# Patient Record
Sex: Female | Born: 1985 | Race: Black or African American | Hispanic: No | Marital: Single | Smoking: Never smoker
Health system: Southern US, Community
[De-identification: ages and names within clinical notes are randomized; demographics above are authoritative.]

## PROBLEM LIST (undated history)

## (undated) ENCOUNTER — Inpatient Hospital Stay (HOSPITAL_COMMUNITY): Payer: Self-pay

## (undated) DIAGNOSIS — Z789 Other specified health status: Secondary | ICD-10-CM

---
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
## 2005-05-17 ENCOUNTER — Inpatient Hospital Stay (HOSPITAL_COMMUNITY): Admission: AD | Admit: 2005-05-17 | Discharge: 2005-05-17 | Payer: Self-pay | Admitting: Obstetrics and Gynecology

## 2005-08-12 ENCOUNTER — Emergency Department (HOSPITAL_COMMUNITY): Admission: EM | Admit: 2005-08-12 | Discharge: 2005-08-12 | Payer: Self-pay | Admitting: Family Medicine

## 2005-11-07 ENCOUNTER — Ambulatory Visit (HOSPITAL_COMMUNITY): Admission: RE | Admit: 2005-11-07 | Discharge: 2005-11-07 | Payer: Self-pay | Admitting: Obstetrics and Gynecology

## 2005-11-21 ENCOUNTER — Ambulatory Visit: Payer: Self-pay | Admitting: *Deleted

## 2005-11-21 ENCOUNTER — Inpatient Hospital Stay (HOSPITAL_COMMUNITY): Admission: AD | Admit: 2005-11-21 | Discharge: 2005-11-21 | Payer: Self-pay | Admitting: *Deleted

## 2006-03-14 ENCOUNTER — Emergency Department (HOSPITAL_COMMUNITY): Admission: EM | Admit: 2006-03-14 | Discharge: 2006-03-14 | Payer: Self-pay | Admitting: Emergency Medicine

## 2006-03-25 ENCOUNTER — Inpatient Hospital Stay (HOSPITAL_COMMUNITY): Admission: AD | Admit: 2006-03-25 | Discharge: 2006-03-25 | Payer: Self-pay | Admitting: Obstetrics and Gynecology

## 2006-03-26 ENCOUNTER — Inpatient Hospital Stay (HOSPITAL_COMMUNITY): Admission: AD | Admit: 2006-03-26 | Discharge: 2006-03-26 | Payer: Self-pay | Admitting: Obstetrics and Gynecology

## 2006-04-06 ENCOUNTER — Inpatient Hospital Stay (HOSPITAL_COMMUNITY): Admission: AD | Admit: 2006-04-06 | Discharge: 2006-04-14 | Payer: Self-pay | Admitting: Obstetrics and Gynecology

## 2006-04-06 ENCOUNTER — Encounter (INDEPENDENT_AMBULATORY_CARE_PROVIDER_SITE_OTHER): Payer: Self-pay | Admitting: Specialist

## 2006-10-25 ENCOUNTER — Emergency Department (HOSPITAL_COMMUNITY): Admission: EM | Admit: 2006-10-25 | Discharge: 2006-10-25 | Payer: Self-pay | Admitting: Emergency Medicine

## 2006-12-22 ENCOUNTER — Emergency Department (HOSPITAL_COMMUNITY): Admission: EM | Admit: 2006-12-22 | Discharge: 2006-12-22 | Payer: Self-pay | Admitting: Emergency Medicine

## 2007-01-03 ENCOUNTER — Emergency Department (HOSPITAL_COMMUNITY): Admission: EM | Admit: 2007-01-03 | Discharge: 2007-01-03 | Payer: Self-pay | Admitting: Family Medicine

## 2007-10-03 ENCOUNTER — Inpatient Hospital Stay (HOSPITAL_COMMUNITY): Admission: AD | Admit: 2007-10-03 | Discharge: 2007-10-03 | Payer: Self-pay | Admitting: Obstetrics and Gynecology

## 2008-01-22 ENCOUNTER — Emergency Department (HOSPITAL_COMMUNITY): Admission: EM | Admit: 2008-01-22 | Discharge: 2008-01-22 | Payer: Self-pay | Admitting: Family Medicine

## 2008-04-17 ENCOUNTER — Inpatient Hospital Stay (HOSPITAL_COMMUNITY): Admission: RE | Admit: 2008-04-17 | Discharge: 2008-04-20 | Payer: Self-pay | Admitting: Obstetrics and Gynecology

## 2008-09-28 ENCOUNTER — Emergency Department (HOSPITAL_COMMUNITY): Admission: EM | Admit: 2008-09-28 | Discharge: 2008-09-28 | Payer: Self-pay | Admitting: Emergency Medicine

## 2009-05-30 ENCOUNTER — Emergency Department (HOSPITAL_COMMUNITY): Admission: EM | Admit: 2009-05-30 | Discharge: 2009-05-30 | Payer: Self-pay | Admitting: Family Medicine

## 2009-06-16 ENCOUNTER — Emergency Department (HOSPITAL_COMMUNITY): Admission: EM | Admit: 2009-06-16 | Discharge: 2009-06-16 | Payer: Self-pay | Admitting: Family Medicine

## 2010-08-31 ENCOUNTER — Emergency Department (HOSPITAL_COMMUNITY): Admission: EM | Admit: 2010-08-31 | Discharge: 2010-08-31 | Payer: Self-pay | Admitting: Family Medicine

## 2011-02-04 LAB — POCT URINALYSIS DIP (DEVICE)
Hgb urine dipstick: NEGATIVE
Nitrite: NEGATIVE
Protein, ur: NEGATIVE mg/dL
Specific Gravity, Urine: >=1.03 (ref 1.005–1.030)
Urobilinogen, UA: 0.2 mg/dL (ref 0.0–1.0)
pH: 5 (ref 5.0–8.0)

## 2011-02-04 LAB — URINE CULTURE: Colony Count: 15000

## 2011-02-04 LAB — WET PREP, GENITAL
Trich, Wet Prep: NONE SEEN
Yeast Wet Prep HPF POC: NONE SEEN

## 2011-02-04 LAB — POCT PREGNANCY, URINE: Preg Test, Ur: NEGATIVE

## 2011-02-04 LAB — GC/CHLAMYDIA PROBE AMP, GENITAL: Chlamydia, DNA Probe: NEGATIVE

## 2011-03-14 NOTE — Op Note (Signed)
Kiara Bates, Kiara Bates                ACCOUNT NO.:  [id number]   MEDICAL RECORD NO.:  [id number]          PATIENT TYPE:  INP   LOCATION:  9145                          FACILITY:  [facility]   PHYSICIAN:  Huel Cote, M.D. DATE OF BIRTH:  Aug 20, 1986   DATE OF PROCEDURE:  04/17/2008  DATE OF DISCHARGE:                               OPERATIVE REPORT   PREOPERATIVE DIAGNOSIS:  1. Previous cesarean section declines vaginal birth after cesarean      section.  2. 39+ weeks' gestation.   POSTOPERATIVE DIAGNOSIS:  1. Previous cesarean section declines vaginal birth after cesarean      section.  2. 39+ weeks' gestation.   PROCEDURE:  Repeat low transverse cesarean section with two-layer  closure of uterus.   SURGEON:  Huel Cote, MD   ASSISTANT:  Sherron Monday, MD   ANESTHESIA:  Spinal.   FINDINGS:  A viable female infant, vertex, Apgars of 9 and 9, weight of 7  pounds 9 ounces, uterus, ovaries and tubes are normal.   SPECIMEN:  Placenta was sent to labor and delivery.   ESTIMATED BLOOD LOSS:  600 mL.   URINE OUTPUT:  200 mL clear urine.   IV FLUIDS:  2300 mL LR.   PROCEDURE:  The patient was taken to the operating room where spinal  anesthesia was obtained without difficulty.  She was then prepped and  draped in normal sterile fashion in dorsal supine position with a  leftward tilt.  A Pfannenstiel's skin incision was then made through a  preexisting scar and carried through to the underlying layer of fascia  by sharp dissection and Bovie cautery.  Fascia was then nicked in the  midline and the incision was extended laterally with Mayo scissors.  The  inferior aspect of the fascia was then grasped with Kocher clamps,  elevated and dissected off the underlying rectus muscles.  In a similar  fashion, the superior aspect was dissected off the rectus muscles.  The  peritoneal cavity was entered on dissecting the fascia and this was  extended both superiorly and inferiorly with  careful attention to avoid  both bowel and bladder.  The Alexis self-retaining wound retractor was  then placed within the incision and lower uterine segment was exposed  nicely.  This was then incised in a transverse fashion and the cavity  itself entered bluntly.  This was then extended bluntly and the  membranes ruptured with clear fluid noted.  The infant's head was then  delivered atraumatically, bulb suctioned and the remainder of the body  delivered without difficulty.  The cord was clamped and cut and the  infant handed to the waiting pediatricians.  The uterus was then  examined and no significant active bleeding noted except for along the  incision line and had nice tone.  The placenta was expressed manually  and handed off to pathology.  The uterine cavity was then cleared of all  clots and debris with moist lap sponge.  The uterine incision was then  closed in two layers, the first in a running locked layer of 0 chromic,  the  second in imbricating layer of the same.  There was some bleeding of  the lower uterine segment which seemed to subside after closure of the  incision.  The uterine incision at this point appeared completely  hemostatic.  The ovaries and tubes were inspected and found to be  normal.  The instruments and sponges were removed from the abdomen.  The  uterine incision once again examined and found to be hemostatic.  Therefore, the rectus muscles were re-approximated with several  interrupted mattress sutures of 0 Vicryl.  The fascia was closed with 0  Vicryl in a running fashion, and the skin was closed with staples.  Sponge, lap and needle counts were correct x2 and the patient was taken  to the recovery room in stable condition.      Huel Cote, M.D.  Electronically Signed     KR/MEDQ  D:  04/17/2008  T:  04/17/2008  Job:  [id number]

## 2011-03-14 NOTE — H&P (Signed)
Kiara, Bates                ACCOUNT NO.:  [id number]   MEDICAL RECORD NO.:  [id number]          PATIENT TYPE:  INP   LOCATION:  NA                            [facility]  [facility]   PHYSICIAN:  Huel Cote, M.D. DATE OF BIRTH:  1986/05/29   DATE OF ADMISSION:  DATE OF DISCHARGE:                              HISTORY & PHYSICAL   The patient is a 25 year old, G2, P1-0-0-1 who is scheduled for a repeat  C-section at 36 plus weeks' gestation.  Her estimated due date is April 22, 2008 by an ultrasound preformed in the first trimester at 24 weeks'  gestation.  She presented for prenatal care at approximately 97 weeks'  gestation and had a quad screen which was normal.  Her prenatal care has  been relatively uneventful except for a history of her prior C-section  and a desire for a repeat C-section and declining trial of labor.  She  had a positive group B strep with her last pregnancy, and the first baby  actually had a group B strep infection and had to go to the NICU.   Prenatal labs are as follows:  B+, antibody negative, RPR nonreactive,  rubella immune, hepatitis B surface antigen negative, HIV negative, GC  negative, chlamydia negative, 1-hour Glucola normal at 119.   Her past medical history is insignificant.   PAST SURGICAL HISTORY:  In 2007, she had a C-section performed for fetal  tachycardia, nonreassuring fetal tracing and was delivered of a 7-pound  9-ounce infant that was in June 2007.  She has had no other pregnancies.   PAST GYNECOLOGIC HISTORY:  None.   ALLERGIES:  Include latex.   MEDICATIONS:  None.   The patient's last documented appointment in the office was June 2.  At  that time, her blood pressure was 120/80, and her weight was 188 pounds.  The fundal height was appropriate for growth measuring 37 for 36 weeks,  and she had a negative urine with no protein or other problems.  The  patient then failed to keep her next appointment, and on the day prior  to surgery was contacted expressing a desire to proceed with the C-  section.  A careful preoperative counseling was done by phone in which  the patient was described the procedure in detail.  We also discussed  the risks and benefits of surgery including bleeding, infection and  possible damage to bowel and bladder.  She was given the opportunity to  reschedule the surgery and have an appointment in person.  However,  strongly insisted that she proceed with her C-section, and assuming on  presentation to the hospital a more current blood pressure and other  parameters are normal, I felt this was reasonable.  Again, she had been  counseled carefully by phone, and I  will see the patient early in the morning 1 hour prior her C-section to  assess the current blood pressure or fetal heart rate and other  parameters to assure she is stable to proceed with surgery.   I will record a physical exam from tomorrow morning after  seeing the  patient.      Huel Cote, M.D.  Electronically Signed     KR/MEDQ  D:  04/16/2008  T:  04/16/2008  Job:  [id number]

## 2011-03-17 NOTE — Discharge Summary (Signed)
NAMEAYSLIN, KUNDERT                ACCOUNT NO.:  [id number]   MEDICAL RECORD NO.:  [id number]          PATIENT TYPE:  INP   LOCATION:  9316                          [facility]ACILITY:  [facility]   PHYSICIAN:  Malachi Pro. Ambrose Mantle, M.D. DATE OF BIRTH:  1986/07/01   DATE OF ADMISSION:  04/06/2006  DATE OF DISCHARGE:  04/14/2006                                 DISCHARGE SUMMARY   HISTORY OF PRESENT ILLNESS:  This 25 year old black female, para 0, gravida  1 at [redacted] weeks gestation presented to the office for nonstress test at 40  weeks with nonreactive strip noted, no decelerations.  The patient  complained of contractions every 3-4 minutes but cervix was minimally  changed.  The patient was sent for delivery given the nonreassuring tracing.  Prenatal care was uncomplicated except for positive group B strep in the  urine.   PRENATAL LABS:  B+ with a negative antibody, sickle cell negative, RPR  nonreactive, rubella immune, hepatitis B surface antigen negative, HIV  declined.  GC and chlamydia negative.  Group B strep positive.  Triple  screen normal was 100.  A 1-hour Glucola was 87.   PAST OBSTETRICAL HISTORY:  Negative.   GYNECOLOGIC HISTORY:  Negative.   SURGICAL HISTORY:  Negative.   MEDICAL HISTORY:  Negative.   ALLERGIES:  The patient was allergic to latex.   PHYSICAL EXAMINATION:  VITAL SIGNS:  On admission, her temperature was noted  to be 10.  Otherwise her vital signs were normal.  Fetal heart rate did show  improved variability after a bolus of fluid.  HEART/LUNGS:  Normal.  ABDOMEN:  The abdomen was soft and nontender.  PELVIC:  Cervix was 2 cm, 70% vertex and -2.  The patient had spontaneous  rupture of membranes with clear fluid.   CHIEF COMPLAINT:  Dr. Senaida Ores thought the temperature elevation was  probably chorioamnionitis.  She was placed on penicillin for positive group  B strep initially but then with the fever was change to IV Unisom.  Fetal  heart rate was acceptable with  good variability, no significant  decelerations.  Intrauterine pressure catheter was placed and fetal scalp  electrode was placed.  By 8:30 p.m., the patient's temperature had spiked to  103.8 degrees Celsius.  Urinalysis was negative.  Cervix was 4 cm.  By 9:40  p.m. the patient was comfortable.  The fetal heart rate increased into 180  days with increased variable decelerations.  The cervix was 5 cm.  Dr.  Senaida Ores felt she should proceed to C-section.  The patient underwent a  low transverse cervical C-section with delivery of a living female infant 7  pounds 9 ounces, Apgars of 1 at one minute, 9 at five minutes.  Uterus,  tubes and ovaries were normal.  The cord pH was 7.24.  A culture of the  placenta was done.  Postoperatively, the patient did acceptably well.  The  IV unison was continued.  By the third postoperative day she had been  afebrile.  She did plan to stay until the fourth postoperative day because  the baby was in the  NICU but on the evening of the third postoperative day,  she did have chills.  Temperature went to 100.5.  The IV antibiotics had  been discontinued, and the Indocin had been begun.  On the fifth  postoperative day, the patient felt cold symptoms, congestion and mild cough  otherwise was asymptomatic.  Temperature had been 101 degrees at 6:00 a.m.  The patient was placed on IV Unisom and gentamicin was begun on April 12, 2006.  The patient's temperature maximum had been 102.8 degrees at 6:00 p.m.  the night before.  She was feeling better and thereafter remained afebrile  for the last 60 hours.  She has had no more chills.  Her temperature maximum  in the last 60 hours has been 99.3 degrees.  She is ready for discharge.  Laboratory data showed no growth in the blood cultures to date.  Initial  hemoglobin 11.8, hematocrit 34.7.  White count 12,100, platelet count  267,000.  Hemoglobin dropped to 8.0, normalized at 8.4, white counts follow-  up were 19.2,  13.4, 14.9 and 15.3.  There were greater than 20% bands on the  initial white count postpartum.  Serum creatinine was 0.7.  Urinalysis was  negative except for 11-20 red cells.  Catheterization urine showed no  growth.  The culture from the placenta showed a rare group B strep.  RPR was  nonreactive.  The patient's staples have been removed.  Strips were applied.  The patient is ready for discharge.   FINAL DIAGNOSES:  Intrauterine pregnancy at 40 weeks with febrile morbidity  abor, presumed secondary to chorioamnionitis.   OPERATION:  Low transverse cervical cesarean section.   FINAL CONDITION:  Improved.   DISCHARGE INSTRUCTIONS:  Instructions include regular diet, no vaginal  entrance, no heavy lifting or strenuous activity.  Call with any temperature  elevation greater than 100 degrees.  Call with any unusual problems.  Follow  our discharge instruction booklet.   DISCHARGE MEDICATIONS:  1.  Augmentin 875 mg one twice a day for seven days.  2.  Percocet 5/325 as needed for pain.  3.  Motrin 600 mg to take as needed for pain.   FOLLOW P:  The patient is to return in 10 days for followup examination.      Malachi Pro. Ambrose Mantle, M.D.     TFH/MEDQ  D:  04/14/2006  T:  04/14/2006  Job:  [id number]

## 2011-03-17 NOTE — Discharge Summary (Signed)
Kiara Bates, Kiara Bates                ACCOUNT NO.:  [id number]   MEDICAL RECORD NO.:  [id number]          PATIENT TYPE:  INP   LOCATION:  9145                          [facility]ACILITY:  [facility]   PHYSICIAN:  Huel Cote, M.D. DATE OF BIRTH:  09/05/1986   DATE OF ADMISSION:  04/17/2008  DATE OF DISCHARGE:  04/20/2008                               DISCHARGE SUMMARY   DISCHARGE DIAGNOSES:  1. Term pregnancy at 56 weeks' gestation.  2. Status post repeat scheduled cesarean section, low-transverse.   DISCHARGE MEDICATIONS:  1. Motrin 600 mg p.o. every 6 hours.  2. Darvocet 1-2 tablets p.o. every 4 hours p.r.n.   DISCHARGE FOLLOWUP:  The patient is to follow up in the office in 2  weeks for an incision check.   HOSPITAL COURSE:  The patient is a 25 year old G2, P1-0-0-1 who was  scheduled for repeat C-section at 39 weeks' gestation.  She underwent  this procedure without difficulty and was delivered of a viable female  infant, vertex presentation.  Apgars were 9 and 9.  Weight was 7 pounds  9 ounces.  She was then admitted for routine postoperative care.  By  postop day #3, she was doing quite well, having no complaints, and  voiding without difficulty.  She was afebrile with stable vital signs.  Fundus was firm and incision was clear.  She was felt stable for  discharge home and her discharge hemoglobin was 9.6.  She is to follow  up in the office as stated in 2 weeks.      Huel Cote, M.D.  Electronically Signed     KR/MEDQ  D:  05/11/2008  T:  05/12/2008  Job:  [id number]

## 2011-03-17 NOTE — Op Note (Signed)
Kiara Bates, ALKINS                ACCOUNT NO.:  [id number]   MEDICAL RECORD NO.:  [id number]          PATIENT TYPE:  INP   LOCATION:  9316                          [facility]   PHYSICIAN:  Huel Cote, M.D. DATE OF BIRTH:  1986/07/29   DATE OF PROCEDURE:  04/06/2006  DATE OF DISCHARGE:                                 OPERATIVE REPORT   PREOPERATIVE DIAGNOSES:  1.  Term pregnancy at 40 weeks, delivered.  2.  Chorioamnionitis.  3.  Persistent fetal tachycardia with variable decelerations remote from      delivery.   POSTOPERATIVE DIAGNOSES:  1.  Term pregnancy at 40 weeks, delivered.  2.  Chorioamnionitis.  3.  Persistent fetal tachycardia with variable decelerations remote from      delivery.  4.  Meconium-stained fluid.   PROCEDURE:  Primary low transverse C-section with double layer closure of  the uterus.   SURGEON:  Dr. Huel Cote   ANESTHESIA:  Epidural.   PLACENTA:  Sent to pathology and placenta cultures were done.   ESTIMATED BLOOD LOSS:  600 mL.   URINE OUTPUT:  100 mL slightly blood-tinged urine upon arrival in the OR  which cleared after delivery.   IV FLUIDS:  1500 mL LR.   FINDINGS:  There is a viable female infant in the vertex presentation.  Apgars  were 1 and 9.  Weight was 7 pounds 9 ounces; normal ovaries and tubes were  noted bilaterally; placenta culture was done   PROCEDURE:  The patient was taken to the operating room where epidural  anesthesia was found be adequate by Allis clamp test.  She was then prepped  and draped in normal sterile fashion in the dorsal supine position with a  leftward tilt.  A Pfannenstiel skin incision was then made with the scalpel  and carried through to underlying layer of fascia by sharp dissection and  Bovie cautery.  Fascia was then opened in the midline and the incision  extended laterally with Mayo scissors.  Inferior aspect was then grasped  with Kocher clamps, elevated, and dissected off the  rectus muscles.  In a  similar fashion, superior aspect was dissected off the rectus muscles.  The  rectus muscles were separated in the midline and the peritoneum entered  bluntly.  Peritoneal incision was then extended both superiorly and  inferiorly with careful attention to avoid both bowel and bladder.  The  Alexis large self-retaining wound retractor was then placed within the  incision and lower uterine segment exposed nicely.  This was then incised in  a transverse fashion and the cavity itself entered bluntly.  At this point,  meconium-stained fluid was noted, although this had not been appreciated  prior to C-section.  The DeLee was obtained.  The infant's head was elevated  to the incision, however, could not quite be delivered digitally.  Therefore  the Kiwi vacuum was placed with ohm, and the infant's head was then  delivered atraumatically.  The mouth and nose were suctioned with DeLee  suction and the remainder of the infant delivered and somewhat quite floppy.  Cord was clamped and cut, and the infant was immediately handed off to the  pediatricians.  Although the infant was floppy at 1 minute, it quickly  regained tone and responded to stimulation and blow-by with a five minute  Apgar of 9.  The cord pH was obtained and had to be venous as the arterial  was inadequate to get a specimen, and the cord pH venous was 7.24.  At this  point, the placenta was delivered manually, and a placenta culture was  taken.  The uterus was cleared of all clots and debris with a moist lap  sponge, and the uterine incision itself was then closed in 2 layers, the  first a running locked layer of 0 chromic.  The second an imbricating layer  of the same.  At this point, excellent hemostasis was noted.  The uterus and  tubes and ovaries were inspected and found to be hemostatic.  There is no  active bleeding noted.  The abdomen and pelvis were copiously irrigated x2,  and the fascia was then  closed with 0 Vicryl in a running fashion.  The skin  was closed with staples.  Sponge, lap, needle counts were correct x2, and  the patient was taken to the recovery room in stable condition.      Huel Cote, M.D.  Electronically Signed     KR/MEDQ  D:  04/06/2006  T:  04/07/2006  Job:  [id number]

## 2011-05-22 ENCOUNTER — Emergency Department (HOSPITAL_COMMUNITY)
Admission: EM | Admit: 2011-05-22 | Discharge: 2011-05-22 | Disposition: A | Discharge status: Home / Self Care | Payer: 59 | Attending: Emergency Medicine | Admitting: Emergency Medicine

## 2011-05-22 DIAGNOSIS — K089 Disorder of teeth and supporting structures, unspecified: Secondary | ICD-10-CM | POA: Insufficient documentation

## 2011-05-22 DIAGNOSIS — K047 Periapical abscess without sinus: Secondary | ICD-10-CM | POA: Insufficient documentation

## 2011-06-07 ENCOUNTER — Inpatient Hospital Stay (HOSPITAL_COMMUNITY): Payer: 59

## 2011-06-07 ENCOUNTER — Inpatient Hospital Stay (HOSPITAL_COMMUNITY)
Admission: AD | Admit: 2011-06-07 | Discharge: 2011-06-07 | Disposition: A | Discharge status: Home / Self Care | Payer: 59 | Source: Ambulatory Visit | Attending: Obstetrics and Gynecology | Admitting: Obstetrics and Gynecology

## 2011-06-07 ENCOUNTER — Encounter (HOSPITAL_COMMUNITY): Payer: Self-pay | Admitting: *Deleted

## 2011-06-07 DIAGNOSIS — O039 Complete or unspecified spontaneous abortion without complication: Secondary | ICD-10-CM

## 2011-06-07 HISTORY — DX: Other specified health status: Z78.9

## 2011-06-07 LAB — HCG, QUANTITATIVE, PREGNANCY: hCG, Beta Chain, Quant, S: 7964 m[IU]/mL — ABNORMAL HIGH (ref <5)

## 2011-06-07 LAB — CBC
HCT: 33.9 % — ABNORMAL LOW (ref 36.0–46.0)
Hemoglobin: 11.7 g/dL — ABNORMAL LOW (ref 12.0–15.0)
MCH: 32.6 pg (ref 26.0–34.0)
MCHC: 34.5 g/dL (ref 30.0–36.0)
Platelets: 270 10*3/uL (ref 150–400)

## 2011-06-07 LAB — ABO/RH: ABO/RH(D): B POS

## 2011-06-07 LAB — POCT PREGNANCY, URINE: Preg Test, Ur: POSITIVE

## 2011-06-07 NOTE — Progress Notes (Signed)
E. RICE, PA  AT BEDSIDE

## 2011-06-07 NOTE — Progress Notes (Signed)
BACK  FROM [facility] .  PT SAYS LESS BLEEDING NOW.  SAYS 10 MIN AGO WHEN GOING TO B-ROOM- PASSED FIST SIZE CLOT.Marland Kitchen  CRAMPING  STILL HURTS BUT LESS OFTEN

## 2011-06-07 NOTE — Progress Notes (Signed)
Was spotting yesterday and soaked a pad this morning since 5pm.  10 wks preg.  HPT+, G3P2

## 2011-06-07 NOTE — ED Provider Notes (Addendum)
History   Pt presents today c/o vag bleeding in preg. She states she began having some mild spotting yesterday and began passing large clots this morning about 5am. She states the bleeding has slowed since coming to the [facility]U. She denies recent intercourse.  Chief Complaint  Patient presents with  . Vaginal Bleeding   HPI  OB History    Grav Para Term Preterm Abortions TAB SAB Ect Mult Living   3 2        2       Past Medical History  Diagnosis Date  . No pertinent past medical history     Past Surgical History  Procedure Date  . Cesarean section     No family history on file.  History  Substance Use Topics  . Smoking status: Never Smoker   . Smokeless tobacco: Not on file  . Alcohol Use: No    Allergies: No Known Allergies  Prescriptions prior to admission  Medication Sig Dispense Refill  . ibuprofen (ADVIL,MOTRIN) 800 MG tablet Take 800 mg by mouth every 8 (eight) hours as needed. For tooth abcess       . penicillin v potassium (VEETID) 500 MG tablet Take 500 mg by mouth 4 (four) times daily.        . traMADol (ULTRAM) 50 MG tablet Take 50 mg by mouth every 6 (six) hours as needed. For pain         Review of Systems  Constitutional: Negative for fever and chills.  Cardiovascular: Negative for chest pain.  Gastrointestinal: Positive for abdominal pain. Negative for nausea, vomiting, diarrhea and constipation.  Genitourinary: Negative for dysuria, urgency, frequency and hematuria.  Neurological: Negative for dizziness and headaches.  Psychiatric/Behavioral: Negative for depression and suicidal ideas.   Physical Exam   Blood pressure 128/81, pulse 85, temperature 98.5 F (36.9 C), temperature source Oral, resp. rate 20, height 5\' 5"  (1.651 m), weight 170 lb (77.111 kg), last menstrual period 03/27/2011.  Physical Exam  Constitutional: She is oriented to person, place, and time. She appears well-developed and well-nourished. No distress.  HENT:  Head:  Normocephalic and atraumatic.  Eyes: EOM are normal. Pupils are equal, round, and reactive to light.  GI: Soft. She exhibits no distension. There is no tenderness. There is no rebound and no guarding.  Genitourinary: There is bleeding around the vagina. No tenderness around the vagina. No foreign body around the vagina. No vaginal discharge found.       Minimal amount of vag bleeding noted on exam. Cervix is slightly dilated.  Neurological: She is alert and oriented to person, place, and time.  Skin: Skin is warm and dry. She is not diaphoretic.  Psychiatric: She has a normal mood and affect. Her behavior is normal. Judgment and thought content normal.    MAU Course  Procedures  Results for orders placed during the hospital encounter of 06/07/11 (from the past 24 hour(s))  POCT PREGNANCY, URINE     Status: Normal   Collection Time   06/07/11  6:13 AM      Component Value Range   Preg Test, Ur POSITIVE    CBC     Status: Abnormal   Collection Time   06/07/11  6:48 AM      Component Value Range   WBC 8.4  4.0 - 10.5 (K/uL)   RBC 3.59 (*) 3.87 - 5.11 (MIL/uL)   Hemoglobin 11.7 (*) 12.0 - 15.0 (g/dL)   HCT 16.1 (*) 09.6 - 46.0 (%)  MCV 94.4  78.0 - 100.0 (fL)   MCH 32.6  26.0 - 34.0 (pg)   MCHC 34.5  30.0 - 36.0 (g/dL)   RDW 78.2  95.6 - 21.3 (%)   Platelets 270  150 - 400 (K/uL)  ABO/RH     Status: Normal   Collection Time   06/07/11  6:48 AM      Component Value Range   ABO/RH(D) B POS     B-quant pending.  [place] report: Findings consistent with a spontaneous abortion in progress in this patient with vaginal bleeding. The endometrium is thickened and heterogeneous, suggesting blood products/products of conception, and there is a cystic area in the endocervical canal that may be the gestational sac.  Assessment and Plan  SAB in progress: discussed with pt at length. B-quant pending. Will have pt return in 2 days for repeat B-quant and reevaluation. Discussed diet, activity, risks,  and precautions. Pt will return immediately if she begins to have worsening sx including heavy vag bleeding. She understood and agreed.  Clinton Gallant. Risa Auman III, DrHSc, MPAS, PA-C  06/07/2011, 8:54 AM   Henrietta Hoover, PA 06/07/11 [phone]

## 2011-06-07 NOTE — Progress Notes (Signed)
PT IN [facility]

## 2011-07-27 LAB — CBC
HCT: 28 — ABNORMAL LOW
HCT: 35.2 — ABNORMAL LOW
Hemoglobin: 11.8 — ABNORMAL LOW
Hemoglobin: 9.6 — ABNORMAL LOW
MCV: 98.5
MCV: 99.1
Platelets: 142 — ABNORMAL LOW
RBC: 3.57 — ABNORMAL LOW

## 2011-07-27 LAB — TYPE AND SCREEN
ABO/RH(D): B POS
Antibody Screen: NEGATIVE

## 2011-08-07 LAB — URINALYSIS, ROUTINE W REFLEX MICROSCOPIC
Bilirubin Urine: NEGATIVE
Hgb urine dipstick: NEGATIVE
pH: 7

## 2011-08-07 LAB — URINE CULTURE

## 2011-08-07 LAB — WET PREP, GENITAL
Trich, Wet Prep: NONE SEEN
Yeast Wet Prep HPF POC: NONE SEEN

## 2011-08-07 LAB — CBC
HCT: 36.1
MCV: 94.4
Platelets: 346
RBC: 3.82 — ABNORMAL LOW
WBC: 10.3

## 2011-08-07 LAB — URINE MICROSCOPIC-ADD ON

## 2012-01-27 ENCOUNTER — Encounter (HOSPITAL_COMMUNITY): Payer: Self-pay | Admitting: *Deleted

## 2012-01-27 ENCOUNTER — Emergency Department (INDEPENDENT_AMBULATORY_CARE_PROVIDER_SITE_OTHER)
Admission: EM | Admit: 2012-01-27 | Discharge: 2012-01-27 | Disposition: A | Discharge status: Home / Self Care | Payer: Medicaid Other | Source: Home / Self Care

## 2012-01-27 DIAGNOSIS — K5289 Other specified noninfective gastroenteritis and colitis: Secondary | ICD-10-CM

## 2012-01-27 DIAGNOSIS — K529 Noninfective gastroenteritis and colitis, unspecified: Secondary | ICD-10-CM

## 2012-01-27 LAB — POCT URINALYSIS DIP (DEVICE)
Bilirubin Urine: NEGATIVE
Glucose, UA: NEGATIVE mg/dL
Nitrite: NEGATIVE
Specific Gravity, Urine: 1.02 (ref 1.005–1.030)
Urobilinogen, UA: 0.2 mg/dL (ref 0.0–1.0)

## 2012-01-27 LAB — POCT PREGNANCY, URINE: Preg Test, Ur: NEGATIVE

## 2012-01-27 MED ORDER — ONDANSETRON HCL 4 MG PO TABS: 4.0000 mg | ORAL_TABLET | Freq: Four times a day (QID) | ORAL | Status: AC

## 2012-01-27 MED ORDER — ONDANSETRON 4 MG PO TBDP
4.0000 mg | ORAL_TABLET | Freq: Once | ORAL | Status: AC
  Administered 2012-01-27: 4 mg via ORAL

## 2012-01-27 MED ORDER — ONDANSETRON 4 MG PO TBDP
ORAL_TABLET | ORAL | Status: AC
  Filled 2012-01-27: qty 1

## 2012-01-27 NOTE — ED Notes (Signed)
Drinking gingerale no further vomiting

## 2012-01-27 NOTE — ED Provider Notes (Signed)
History     CSN: [id number]  Arrival date & time 01/27/12  1723   None     Chief Complaint  Patient presents with  . Nausea  . Emesis  . Diarrhea  . Generalized Body Aches    (Consider location/radiation/quality/duration/timing/severity/associated sxs/prior treatment) HPI Comments: Vomiting and diarrhea since yesterday. Unable to keep liquids down. Aches all over. No fever.   Patient is a 26 y.o. female presenting with vomiting and diarrhea. The history is provided by the patient.  Emesis  This is a new problem. The current episode started yesterday. The problem occurs 2 to 4 times per day. The problem has not changed since onset.The emesis has an appearance of stomach contents. There has been no fever. Associated symptoms include diarrhea and myalgias. Pertinent negatives include no abdominal pain, no chills and no fever.  Diarrhea The primary symptoms include nausea, vomiting, diarrhea and myalgias. Primary symptoms do not include fever or abdominal pain. The illness began yesterday. The problem has not changed since onset. The diarrhea began yesterday. The diarrhea is watery. The diarrhea occurs 2 to 4 times per day.  The illness does not include chills.    Past Medical History  Diagnosis Date  . No pertinent past medical history     Past Surgical History  Procedure Date  . Cesarean section     History reviewed. No pertinent family history.  History  Substance Use Topics  . Smoking status: Never Smoker   . Smokeless tobacco: Not on file  . Alcohol Use: No    OB History    Grav Para Term Preterm Abortions TAB SAB Ect Mult Living   3 2        2       Review of Systems  Constitutional: Negative for fever and chills.  Gastrointestinal: Positive for nausea, vomiting and diarrhea. Negative for abdominal pain and blood in stool.  Musculoskeletal: Positive for myalgias.    Allergies  Penicillins  Home Medications   Current Outpatient Rx  Name Route Sig  Dispense Refill  . ACETAMINOPHEN-CODEINE #3 300-30 MG PO TABS Oral Take 1 tablet by mouth every 4 (four) hours as needed.    Marland Kitchen TRAMADOL HCL 50 MG PO TABS Oral Take 50 mg by mouth every 6 (six) hours as needed. For pain     . IBUPROFEN 800 MG PO TABS Oral Take 800 mg by mouth every 8 (eight) hours as needed. For tooth abcess     . ONDANSETRON HCL 4 MG PO TABS Oral Take 1 tablet (4 mg total) by mouth every 6 (six) hours. 12 tablet 0  . PENICILLIN V POTASSIUM 500 MG PO TABS Oral Take 500 mg by mouth 4 (four) times daily.        BP 118/78  Pulse 86  Temp(Src) 99.6 F (37.6 C) (Oral)  Resp 18  SpO2 97%  LMP 01/26/2012  Breastfeeding? Unknown  Physical Exam  Constitutional: She appears well-developed and well-nourished. No distress.  Cardiovascular: Normal rate and regular rhythm.   Pulmonary/Chest: Effort normal and breath sounds normal.  Abdominal: Soft. Bowel sounds are increased. There is no tenderness. There is no CVA tenderness.    ED Course  Procedures (including critical care time)  Labs Reviewed  POCT URINALYSIS DIP (DEVICE) - Abnormal; Notable for the following:    Hgb urine dipstick LARGE (*)    All other components within normal limits  POCT PREGNANCY, URINE   No results found.   1. Gastroenteritis  MDM  Tolerated oral trial of fluids after zofran.          Cathlyn Parsons, NP 01/27/12 2108

## 2012-01-27 NOTE — Discharge Instructions (Signed)
Clear liquid diet for the next 24 hours, then switch to the B.R.A.T. Diet and slowly add back regular foods over the next week.  Avoid dairy, greasy or spicy foods for the next week.   Clear Liquid Diet The clear liquid dietconsists of foods that are liquid or will become liquid at room temperature.You should be able to see through the liquid and beverages. Examples of foods allowed on a clear liquid diet include fruit juice, broth or bouillon, gelatin, or frozen ice pops. The purpose of this diet is to provide necessary fluid, electrolytes such as sodium and potassium, and energy to keep the body functioning during times when you are not able to consume a regular diet.A clear liquid diet should not be continued for long periods of time as it is not nutritionally adequate.  REASONS FOR USING A CLEAR LIQUID DIET  In sudden onset (acute) conditions for a patient before or after surgery.   As the first step in oral feeding.   For fluid and electrolyte replacement in diarrheal diseases.   As a diet before certain medical tests are performed.  ADEQUACY The clear liquid diet is adequate only in ascorbic acid, according to the Recommended Dietary Allowances of the [employer]. CHOOSING FOODS Breads and Starches  Allowed:  None are allowed.   Avoid: All are avoided.  Vegetables  Allowed:  Strained tomato or vegetable juice.   Avoid: Any others.  Fruit  Allowed:  Strained fruit juices and fruit drinks. Include 1 serving of citrus or vitamin C-enriched fruit juice daily.   Avoid: Any others.  Meat and Meat Substitutes  Allowed:  None are allowed.   Avoid: All are avoided.  Milk  Allowed:  None are allowed.   Avoid: All are avoided.  Soups and Combination Foods  Allowed:  Clear bouillon, broth, or strained broth-based soups.   Avoid: Any others.  Desserts and Sweets  Allowed:  Sugar, honey. High protein gelatin. Flavored gelatin, ices, or frozen ice pops that do  not contain milk.   Avoid: Any others.  Fats and Oils  Allowed:  None are allowed.   Avoid: All are avoided.  Beverages  Allowed: Cereal beverages, coffee (regular or decaffeinated), tea, or soda at the discretion of your caregiver.   Avoid: Any others.  Condiments  Allowed:  Iodized salt.   Avoid: Any others, including pepper.  Supplements  Allowed:  Liquid nutrition beverages.   Avoid: Any others that contain lactose or fiber.  SAMPLE MEAL PLAN Breakfast  4 oz (120 mL) strained orange juice.    to 1 cup (125 to 250 mL) gelatin (plain or fortified).   1 cup (250 mL) beverage (coffee or tea).   Sugar, if desired.  Midmorning Snack   cup (125 mL) gelatin (plain or fortified).  Lunch  1 cup (250 mL) broth or consomm.   4 oz (120 mL) strained grapefruit juice.    cup (125 mL) gelatin (plain or fortified).   1 cup (250 mL) beverage (coffee or tea).   Sugar, if desired.  Midafternoon Snack   cup (125 mL) fruit ice.    cup (125 mL) strained fruit juice.  Dinner  1 cup (250 mL) broth or consomm.    cup (125 mL) cranberry juice.    cup (125 mL) flavored gelatin (plain or fortified).   1 cup (250 mL) beverage (coffee or tea).   Sugar, if desired.  Evening Snack  4 oz (120 mL) strained apple juice (vitamin C-fortified).  cup (125 mL) flavored gelatin (plain or fortified).  Document Released: 10/16/2005 Document Revised: 10/05/2011 Document Reviewed: 01/13/2011 [facility] Patient Information 2012 [place], [place].B.R.A.T. Diet Your doctor has recommended the B.R.A.T. diet for you or your child until the condition improves. This is often used to help control diarrhea and vomiting symptoms. If you or your child can tolerate clear liquids, you may have:  Bananas.   Rice.   Applesauce.   Toast (and other simple starches such as crackers, potatoes, noodles).  Be sure to avoid dairy products, meats, and fatty foods until symptoms are better.  Fruit juices such as apple, grape, and prune juice can make diarrhea worse. Avoid these. Continue this diet for 2 days or as instructed by your caregiver. Document Released: 10/16/2005 Document Revised: 10/05/2011 Document Reviewed: 04/04/2007 [facility] Patient Information 2012 [place], [place].Viral Gastroenteritis Viral gastroenteritis is also known as stomach flu. This condition affects the stomach and intestinal tract. It can cause sudden diarrhea and vomiting. The illness typically lasts 3 to 8 days. Most people develop an immune response that eventually gets rid of the virus. While this natural response develops, the virus can make you quite ill. CAUSES  Many different viruses can cause gastroenteritis, such as rotavirus or noroviruses. You can catch one of these viruses by consuming contaminated food or water. You may also catch a virus by sharing utensils or other personal items with an infected person or by touching a contaminated surface. SYMPTOMS  The most common symptoms are diarrhea and vomiting. These problems can cause a severe loss of body fluids (dehydration) and a body salt (electrolyte) imbalance. Other symptoms may include:  Fever.   Headache.   Fatigue.   Abdominal pain.  DIAGNOSIS  Your caregiver can usually diagnose viral gastroenteritis based on your symptoms and a physical exam. A stool sample may also be taken to test for the presence of viruses or other infections. TREATMENT  This illness typically goes away on its own. Treatments are aimed at rehydration. The most serious cases of viral gastroenteritis involve vomiting so severely that you are not able to keep fluids down. In these cases, fluids must be given through an intravenous line (IV). HOME CARE INSTRUCTIONS   Drink enough fluids to keep your urine clear or pale yellow. Drink small amounts of fluids frequently and increase the amounts as tolerated.   Ask your caregiver for specific rehydration instructions.     Avoid:   Foods high in sugar.   Alcohol.   Carbonated drinks.   Tobacco.   Juice.   Caffeine drinks.   Extremely hot or cold fluids.   Fatty, greasy foods.   Too much intake of anything at one time.   Dairy products until 24 to 48 hours after diarrhea stops.   You may consume probiotics. Probiotics are active cultures of beneficial bacteria. They may lessen the amount and number of diarrheal stools in adults. Probiotics can be found in yogurt with active cultures and in supplements.   Wash your hands well to avoid spreading the virus.   Only take over-the-counter or prescription medicines for pain, discomfort, or fever as directed by your caregiver. Do not give aspirin to children. Antidiarrheal medicines are not recommended.   Ask your caregiver if you should continue to take your regular prescribed and over-the-counter medicines.   Keep all follow-up appointments as directed by your caregiver.  SEEK IMMEDIATE MEDICAL CARE IF:   You are unable to keep fluids down.   You do not urinate at least  once every 6 to 8 hours.   You develop shortness of breath.   You notice blood in your stool or vomit. This may look like coffee grounds.   You have abdominal pain that increases or is concentrated in one small area (localized).   You have persistent vomiting or diarrhea.   You have a fever.   The patient is a child younger than 3 months, and he or she has a fever.   The patient is a child older than 3 months, and he or she has a fever and persistent symptoms.   The patient is a child older than 3 months, and he or she has a fever and symptoms suddenly get worse.   The patient is a baby, and he or she has no tears when crying.  MAKE SURE YOU:   Understand these instructions.   Will watch your condition.   Will get help right away if you are not doing well or get worse.  Document Released: 10/16/2005 Document Revised: 10/05/2011 Document Reviewed:  08/02/2011 [facility] Patient Information 2012 [place], [place].

## 2012-01-27 NOTE — ED Notes (Signed)
Pt with onset of n/v/d with generalized body aches yesterday - vomited x 4 today and diarrhea x 2 today - per pt drinking gatorade unable to keep liquids down

## 2012-01-29 NOTE — ED Provider Notes (Signed)
Medical screening examination/treatment/procedure(s) were performed by resident physician or non-physician practitioner and as supervising physician I was immediately available for consultation/collaboration.   Barkley Bruns MD.    Linna Hoff, MD 01/29/12 [phone]

## 2012-06-03 DIAGNOSIS — K029 Dental caries, unspecified: Secondary | ICD-10-CM | POA: Insufficient documentation

## 2012-06-03 DIAGNOSIS — Z880 Allergy status to penicillin: Secondary | ICD-10-CM | POA: Insufficient documentation

## 2012-06-03 NOTE — ED Notes (Signed)
Pt to ED c/o slow onset L lower jaw pain. She believes it is r/t her wisdom teeth coming in.

## 2012-06-04 ENCOUNTER — Emergency Department (HOSPITAL_COMMUNITY)
Admission: EM | Admit: 2012-06-04 | Discharge: 2012-06-04 | Disposition: A | Discharge status: Home / Self Care | Payer: Medicaid Other | Attending: Emergency Medicine | Admitting: Emergency Medicine

## 2012-06-04 DIAGNOSIS — K0889 Other specified disorders of teeth and supporting structures: Secondary | ICD-10-CM

## 2012-06-04 MED ORDER — ERYTHROMYCIN BASE 333 MG PO TBEC: 333.0000 mg | DELAYED_RELEASE_TABLET | Freq: Three times a day (TID) | ORAL | Status: AC

## 2012-06-04 MED ORDER — OXYCODONE-ACETAMINOPHEN 5-325 MG PO TABS: 1.0000 | ORAL_TABLET | Freq: Four times a day (QID) | ORAL | Status: AC | PRN

## 2012-06-04 NOTE — ED Notes (Signed)
The patient is AOx4 and comfortable with her discharge instructions. 

## 2012-06-04 NOTE — ED Provider Notes (Signed)
History     CSN: [id number]  Arrival date & time 06/03/12  2338   First MD Initiated Contact with Patient 06/04/12 0258      Chief Complaint  Patient presents with  . Dental Pain    left side    (Consider location/radiation/quality/duration/timing/severity/associated sxs/prior treatment) Patient is a 26 y.o. female presenting with tooth pain. The history is provided by the patient.  Dental PainThe primary symptoms include mouth pain. Primary symptoms do not include fever. The symptoms began 2 days ago. The symptoms are worsening. The symptoms are recurrent. The symptoms occur constantly.  Additional symptoms include: gum swelling and gum tenderness.    Past Medical History  Diagnosis Date  . No pertinent past medical history     Past Surgical History  Procedure Date  . Cesarean section     No family history on file.  History  Substance Use Topics  . Smoking status: Never Smoker   . Smokeless tobacco: Not on file  . Alcohol Use: No    OB History    Grav Para Term Preterm Abortions TAB SAB Ect Mult Living   3 2        2       Review of Systems  Constitutional: Negative for fever.  All other systems reviewed and are negative.    Allergies  Penicillins  Home Medications   Current Outpatient Rx  Name Route Sig Dispense Refill  . ACETAMINOPHEN 500 MG PO TABS Oral Take 500 mg by mouth every 6 (six) hours as needed. For pain    . ERYTHROMYCIN BASE 333 MG PO TBEC Oral Take 1 tablet (333 mg total) by mouth 3 (three) times daily. 30 tablet 0  . OXYCODONE-ACETAMINOPHEN 5-325 MG PO TABS Oral Take 1-2 tablets by mouth every 6 (six) hours as needed for pain. 20 tablet 0    BP 118/79  Pulse 64  Temp 98.1 F (36.7 C) (Oral)  Resp 14  SpO2 98%  Breastfeeding? Unknown  Physical Exam  Nursing note and vitals reviewed. Constitutional: She is oriented to person, place, and time. She appears well-developed and well-nourished. No distress.  HENT:  Head: Normocephalic  and atraumatic.       The left upper wisdom tooth is heavily decayed with surrounding erythema.    Neck: Normal range of motion. Neck supple.  Musculoskeletal: Normal range of motion.  Lymphadenopathy:    She has no cervical adenopathy.  Neurological: She is alert and oriented to person, place, and time.  Skin: Skin is warm and dry. She is not diaphoretic.    ED Course  Procedures (including critical care time)  Labs Reviewed - No data to display No results found.   1. Pain, dental       MDM  Will treat with pain meds, antibiotics.  Return prn if worsens.        Geoffery Lyons, MD 06/04/12 [phone]

## 2012-08-12 IMAGING — US US OB COMP LESS 14 WK
1 series · 13 of 28 positions shown · non-contrast
Comparison: none

[Series 1: us ob comp less 14 wks · 13 of 69 slices shown]
[im 3/69]
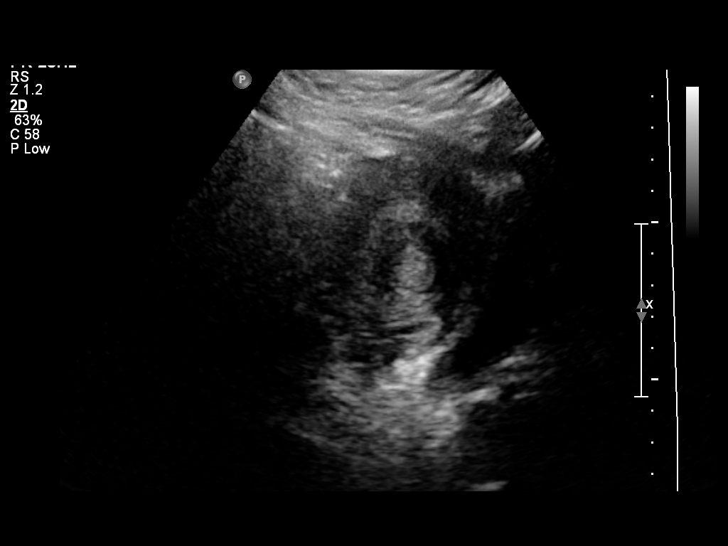
[im 8/69]
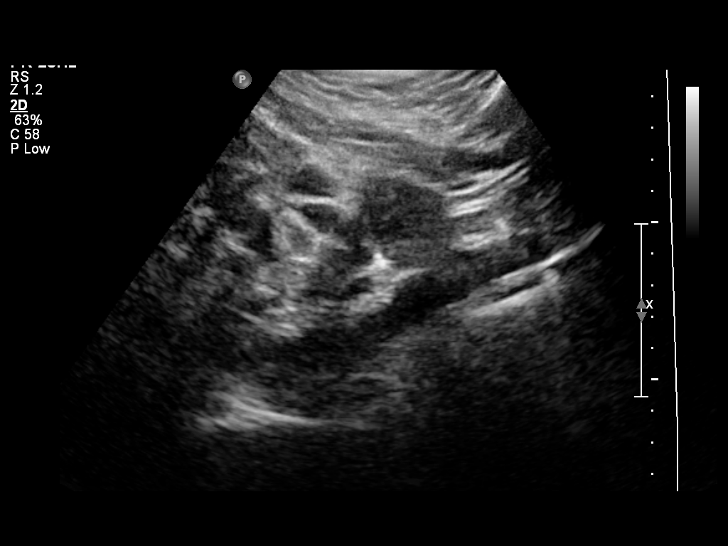
[im 13/69]
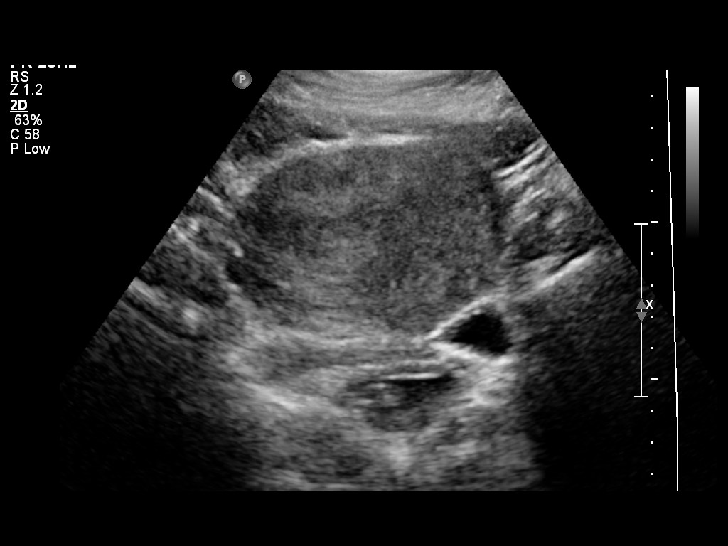
[im 18/69]
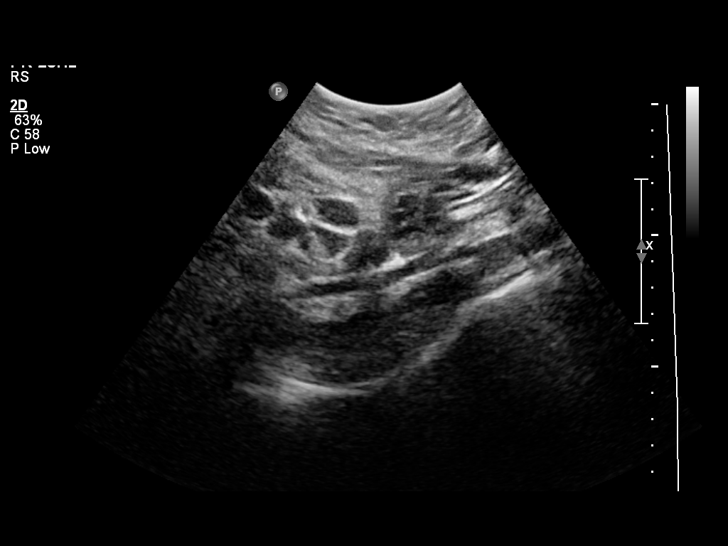
[im 23/69]
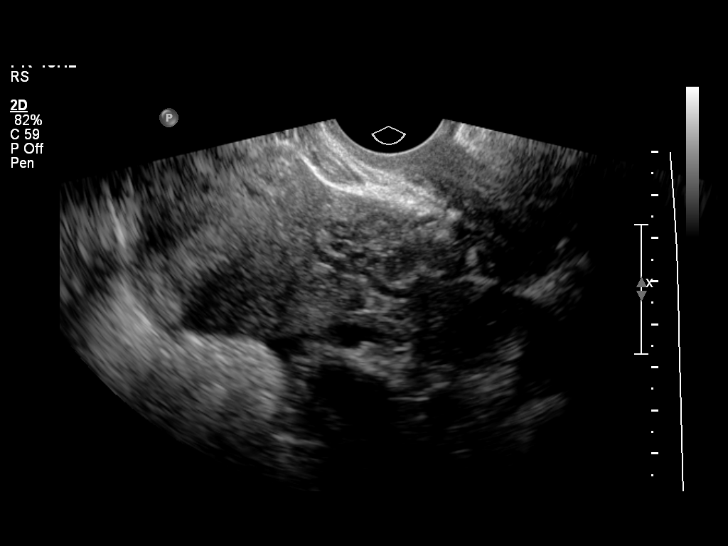
[im 28/69]
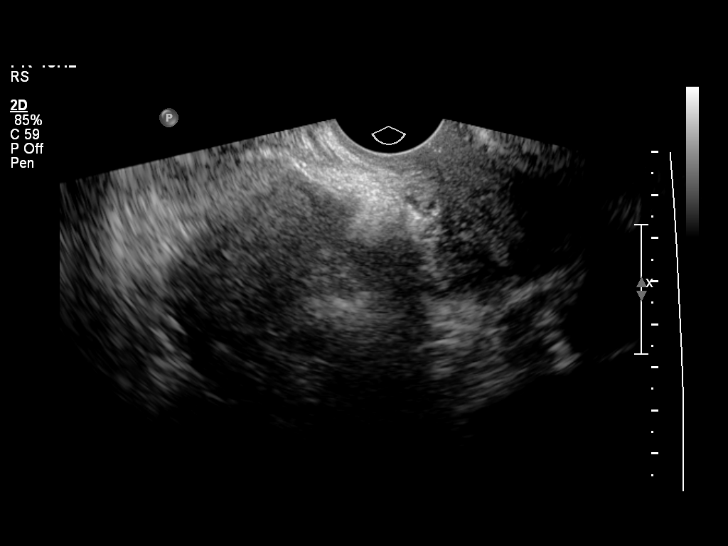
[im 36/69]
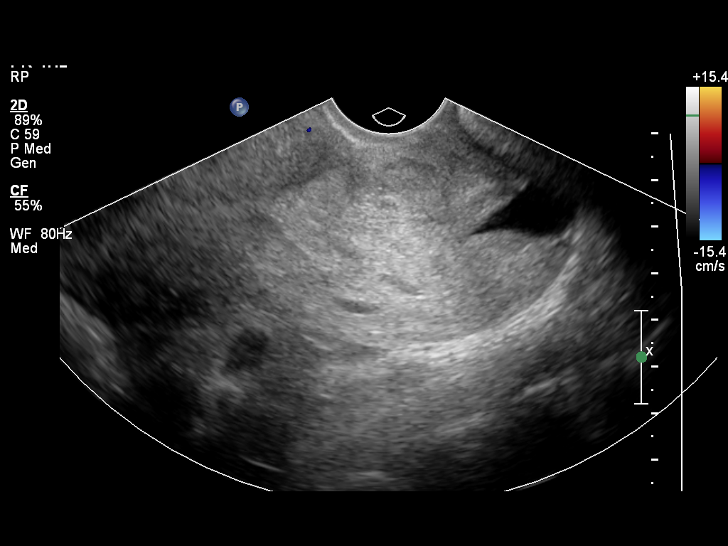
[im 41/69]
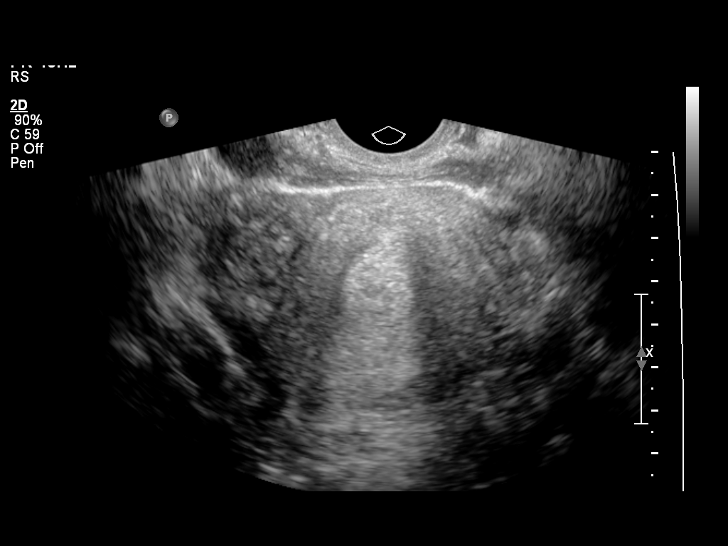
[im 46/69]
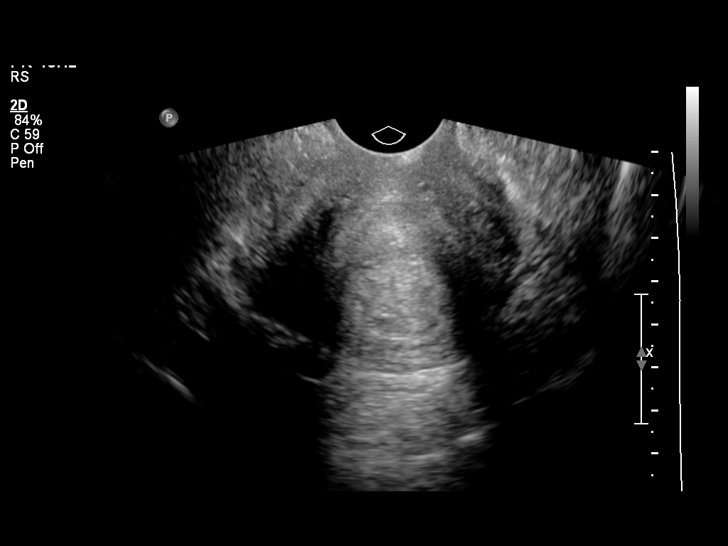
[im 51/69]
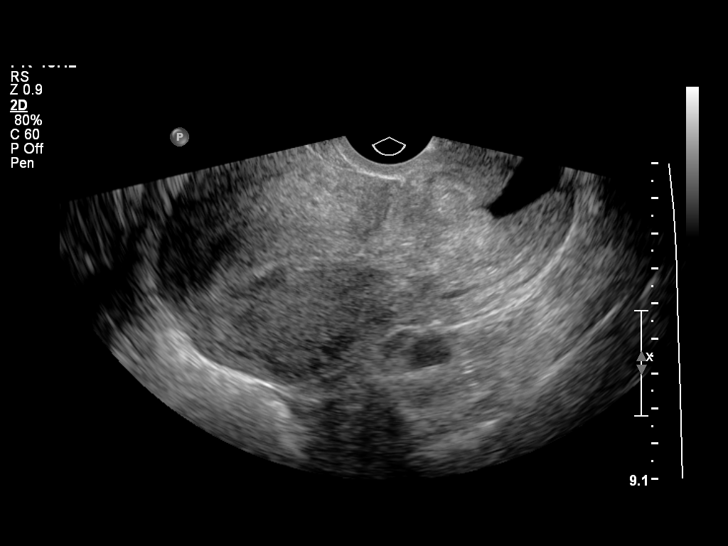
[im 56/69]
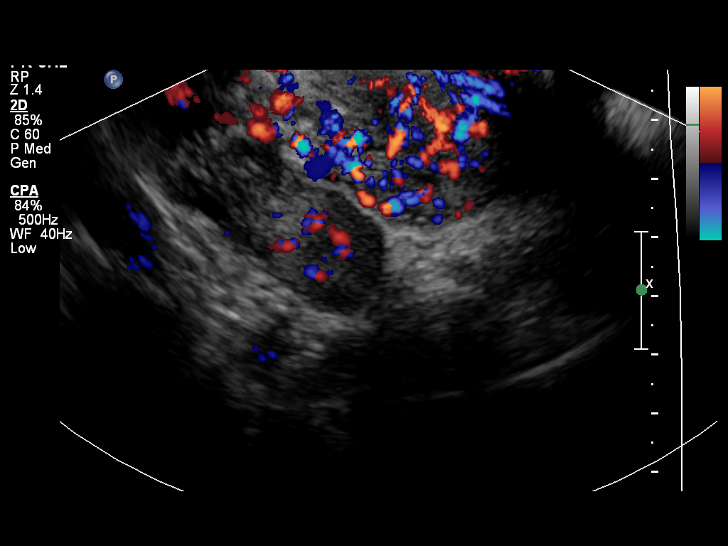
[im 61/69]
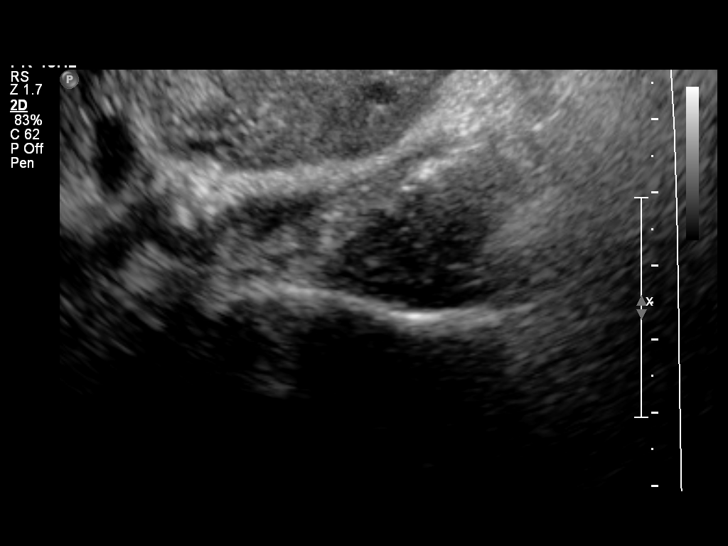
[im 66/69]
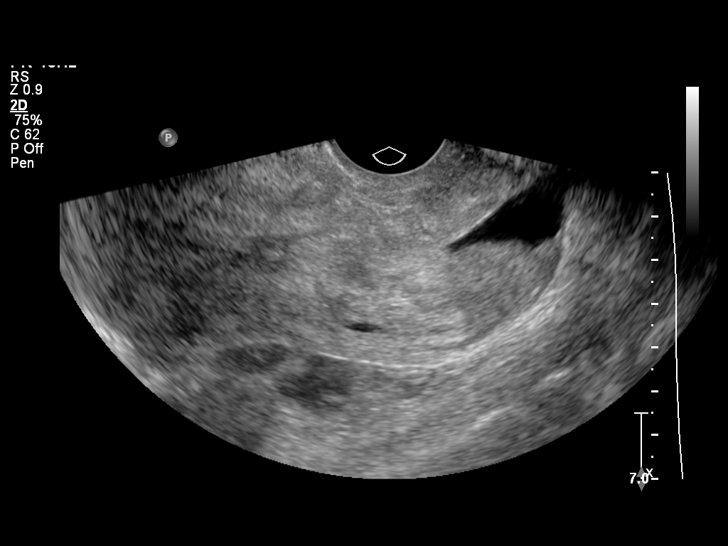

[13 of 28 positions shown; findings below may reference images not displayed]

OBSTETRICS REPORT
                      (Signed Final 06/07/2011 [DATE])

                 04_E
Procedures

 US OB COMP LESS 14 WKS                                76801.0
 US OB TRANSVAGINAL                                    76817.0
Indications

 Vaginal bleeding, unknown etiology
 Pain - Abdominal/Pelvic
Fetal Evaluation

 Gest. Sac:         None seen in uterine
                    body.
 Yolk Sac:          Not visualized
 Fetal Pole:        Not visualized
 Cardiac Activity:  No embryo visualized
Cervix Uterus Adnexa

 Cervix:       Open.
 Uterus:       Thickened heterogeneous endometrium (2.2 cm) with
               cystic area in the endocervical canal.
 Left Ovary:    Within normal limits.
 Right Ovary:   Within normal limits.

 Adnexa:     No abnormality visualized.
Impression

 Findings consistent with a spontaneous abortion in progress
 in this patient with vaginal bleeding. The endometrium is
 thickened and heterogeneous, suggesting blood
 products/products of conception, and there is a cystic area in
 the endocervcial canal that may be the gestational sac.
Recommendations

 Consider follow-up ultrasound to exclude persistent
 endometrial thickening and to evaluate for passage of the
 possible gestational sac in the endocervical canal, if clinically
 indicated.
 questions or concerns.

## 2013-09-17 ENCOUNTER — Emergency Department (INDEPENDENT_AMBULATORY_CARE_PROVIDER_SITE_OTHER)
Admission: EM | Admit: 2013-09-17 | Discharge: 2013-09-17 | Disposition: A | Discharge status: Home / Self Care | Payer: Self-pay | Source: Home / Self Care

## 2013-09-17 ENCOUNTER — Encounter (HOSPITAL_COMMUNITY): Payer: Self-pay | Admitting: Emergency Medicine

## 2013-09-17 DIAGNOSIS — J029 Acute pharyngitis, unspecified: Secondary | ICD-10-CM

## 2013-09-17 LAB — POCT RAPID STREP A: Streptococcus, Group A Screen (Direct): NEGATIVE

## 2013-09-17 MED ORDER — AZITHROMYCIN 250 MG PO TABS: ORAL_TABLET | ORAL | Status: DC

## 2013-09-17 NOTE — ED Notes (Signed)
Fever, sore throat, chills, body aches, headache, cough

## 2013-09-17 NOTE — ED Provider Notes (Signed)
Medical screening examination/treatment/procedure(s) were performed by a resident physician or non-physician practitioner and as the supervising physician I was immediately available for consultation/collaboration.  Marrisa Kimber, MD    Esequiel Kleinfelter S Marlyn Tondreau, MD 09/17/13 1138 

## 2013-09-17 NOTE — ED Notes (Signed)
Patient seen prior to this nurse arriving on duty

## 2013-09-17 NOTE — ED Provider Notes (Signed)
CSN: [id number]     Arrival date & time 09/17/13  0849 History   First MD Initiated Contact with Patient 09/17/13 0900     No chief complaint on file.  (Consider location/radiation/quality/duration/timing/severity/associated sxs/prior Treatment) HPI Comments: 27 y o F wit gradual onset over past 2 d of chills, H/A, body aches, ST, and fever today of 102.8 at home.   Past Medical History  Diagnosis Date  . No pertinent past medical history    Past Surgical History  Procedure Laterality Date  . Cesarean section     No family history on file. History  Substance Use Topics  . Smoking status: Never Smoker   . Smokeless tobacco: Not on file  . Alcohol Use: No   OB History   Grav Para Term Preterm Abortions TAB SAB Ect Mult Living   3 2        2      Review of Systems  Constitutional: Positive for fever and activity change.  HENT: Positive for rhinorrhea and sore throat. Negative for congestion and ear discharge.   Respiratory: Negative for cough, choking, shortness of breath and wheezing.   Cardiovascular: Negative for chest pain and palpitations.  Gastrointestinal: Negative.   Genitourinary: Negative.   Musculoskeletal: Positive for myalgias.  Skin: Negative.     Allergies  Penicillins  Home Medications   Current Outpatient Rx  Name  Route  Sig  Dispense  Refill  . acetaminophen (TYLENOL) 500 MG tablet   Oral   Take 500 mg by mouth every 6 (six) hours as needed. For pain         . azithromycin (ZITHROMAX) 250 MG tablet      2 tabs daily for 5 d   10 each   0    BP 103/69  Pulse 119  Temp(Src) 99.9 F (37.7 C) (Oral)  Resp 20  SpO2 97% Physical Exam  Nursing note and vitals reviewed. Constitutional: She is oriented to person, place, and time. She appears well-developed and well-nourished. No distress.  HENT:  Right Ear: External ear normal.  Left Ear: External ear normal.  OP with erythema, mild swelling and several exudates.  Eyes: Conjunctivae and  EOM are normal.  Neck: Normal range of motion. Neck supple.  Cardiovascular: Normal rate, regular rhythm and normal heart sounds.   Pulmonary/Chest: Effort normal and breath sounds normal. No respiratory distress. She has no wheezes. She has no rales.  Musculoskeletal: Normal range of motion. She exhibits no edema.  Lymphadenopathy:    She has cervical adenopathy.  Neurological: She is alert and oriented to person, place, and time.  Skin: Skin is warm and dry. No rash noted.  Psychiatric: She has a normal mood and affect.    ED Course  Procedures (including critical care time) Labs Review Labs Reviewed  RAPID STREP SCREEN   Imaging Review No results found.    MDM   1. Exudative pharyngitis      Azithromycin 500 q d for 5 d Plenty of fluids No work for at least 1 d of ABX  Hayden Rasmussen, NP 09/17/13 [phone]

## 2013-09-19 LAB — CULTURE, GROUP A STREP

## 2014-02-20 ENCOUNTER — Encounter (HOSPITAL_COMMUNITY): Payer: Self-pay | Admitting: *Deleted

## 2014-02-20 ENCOUNTER — Inpatient Hospital Stay (HOSPITAL_COMMUNITY): Payer: BC Managed Care – PPO

## 2014-02-20 ENCOUNTER — Inpatient Hospital Stay (HOSPITAL_COMMUNITY)
Admission: AD | Admit: 2014-02-20 | Discharge: 2014-02-20 | Disposition: A | Discharge status: Home / Self Care | Payer: Self-pay | Source: Ambulatory Visit | Attending: Obstetrics & Gynecology | Admitting: Obstetrics & Gynecology

## 2014-02-20 DIAGNOSIS — O200 Threatened abortion: Secondary | ICD-10-CM | POA: Insufficient documentation

## 2014-02-20 DIAGNOSIS — Z3490 Encounter for supervision of normal pregnancy, unspecified, unspecified trimester: Secondary | ICD-10-CM

## 2014-02-20 DIAGNOSIS — Z3480 Encounter for supervision of other normal pregnancy, unspecified trimester: Secondary | ICD-10-CM

## 2014-02-20 DIAGNOSIS — O4690 Antepartum hemorrhage, unspecified, unspecified trimester: Secondary | ICD-10-CM

## 2014-02-20 DIAGNOSIS — O208 Other hemorrhage in early pregnancy: Secondary | ICD-10-CM | POA: Insufficient documentation

## 2014-02-20 DIAGNOSIS — O418X90 Other specified disorders of amniotic fluid and membranes, unspecified trimester, not applicable or unspecified: Secondary | ICD-10-CM

## 2014-02-20 DIAGNOSIS — O468X9 Other antepartum hemorrhage, unspecified trimester: Secondary | ICD-10-CM

## 2014-02-20 HISTORY — DX: Other specified health status: Z78.9

## 2014-02-20 LAB — WET PREP, GENITAL
Trich, Wet Prep: NONE SEEN
YEAST WET PREP: NONE SEEN

## 2014-02-20 LAB — CBC
HEMATOCRIT: 34.6 % — AB (ref 36.0–46.0)
Hemoglobin: 11.8 g/dL — ABNORMAL LOW (ref 12.0–15.0)
MCH: 31.6 pg (ref 26.0–34.0)
MCHC: 34.1 g/dL (ref 30.0–36.0)
MCV: 92.5 fL (ref 78.0–100.0)
PLATELETS: 317 10*3/uL (ref 150–400)
RBC: 3.74 MIL/uL — ABNORMAL LOW (ref 3.87–5.11)
RDW: 13.3 % (ref 11.5–15.5)
WBC: 9.4 10*3/uL (ref 4.0–10.5)

## 2014-02-20 LAB — POCT PREGNANCY, URINE: PREG TEST UR: POSITIVE — AB

## 2014-02-20 LAB — HCG, QUANTITATIVE, PREGNANCY: HCG, BETA CHAIN, QUANT, S: 77081 m[IU]/mL — AB (ref <5)

## 2014-02-20 NOTE — MAU Note (Signed)
Found out preg 2 wks ago(+HPT).  This morning had some bleeding and was passing clots, lighter now.  Hx of SAB

## 2014-02-20 NOTE — MAU Provider Note (Signed)
History     CSN: [id number]  Arrival date and time: 02/20/14 1112   First Provider Initiated Contact with Patient 02/20/14 1202      Chief Complaint  Patient presents with  . Threatened Miscarriage   HPI  [name] is a 28 y.o. female G4P2 at [redacted]w[redacted]d. Pt presents with vaginal bleeding. She started bleeding this morning; a few clots were noted and when she wiped she saw streaks of blood. She has a panty line on, however very minimal amount of blood is noted. 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 During the passing of clots she felt abdominal cramping, currently she is not having any cramping. She plans to start care with [facility]  OB History   Grav Para Term Preterm Abortions TAB SAB Ect Mult Living   4 2        2       Past Medical History  Diagnosis Date  . No pertinent past medical history   . Medical history non-contributory     Past Surgical History  Procedure Laterality Date  . Cesarean section      History reviewed. No pertinent family history.  History  Substance Use Topics  . Smoking status: Never Smoker   . Smokeless tobacco: Not on file  . Alcohol Use: No    Allergies:  Allergies  Allergen Reactions  . Penicillins Rash    Prescriptions prior to admission  Medication Sig Dispense Refill  . acetaminophen (TYLENOL) 325 MG tablet Take 650 mg by mouth every 6 (six) hours as needed for mild pain or headache.      . Multiple Vitamin (MULTIVITAMIN WITH MINERALS) TABS tablet Take 1 tablet by mouth daily.      . naproxen sodium (ANAPROX) 220 MG tablet Take 440 mg by mouth daily as needed (pain).       Results for orders placed during the hospital encounter of 02/20/14 (from the past 48 hour(s))  POCT PREGNANCY, URINE     Status: Abnormal   Collection Time    02/20/14 11:30 AM      Result Value Ref Range   Preg Test, Ur POSITIVE (*) NEGATIVE   Comment:            THE SENSITIVITY OF THIS      METHODOLOGY IS >24 mIU/mL  CBC     Status: Abnormal   Collection Time    02/20/14 12:00 PM      Result Value Ref Range   WBC 9.4  4.0 - 10.5 K/uL   RBC 3.74 (*) 3.87 - 5.11 MIL/uL   Hemoglobin 11.8 (*) 12.0 - 15.0 g/dL   HCT 40.9 (*) 81.1 - 91.4 %   MCV 92.5  78.0 - 100.0 fL   MCH 31.6  26.0 - 34.0 pg   MCHC 34.1  30.0 - 36.0 g/dL   RDW 78.2  95.6 - 21.3 %   Platelets 317  150 - 400 K/uL  HCG, QUANTITATIVE, PREGNANCY     Status: Abnormal   Collection Time    02/20/14 12:00 PM      Result Value Ref Range   hCG, Beta Chain, Quant, S 08657 (*) <5 mIU/mL   Comment:              GEST. AGE      CONC.  (mIU/mL)       <=1 WEEK        5 -  50         2 WEEKS       50 - 500         3 WEEKS       100 - 10,000         4 WEEKS     1,000 - 30,000         5 WEEKS     3,500 - 115,000       6-8 WEEKS     12,000 - 270,000        12 WEEKS     15,000 - 220,000                FEMALE AND NON-PREGNANT FEMALE:         LESS THAN 5 mIU/mL  WET PREP, GENITAL     Status: Abnormal   Collection Time    02/20/14 12:10 PM      Result Value Ref Range   Yeast Wet Prep HPF POC NONE SEEN  NONE SEEN   Trich, Wet Prep NONE SEEN  NONE SEEN   Clue Cells Wet Prep HPF POC FEW (*) NONE SEEN   WBC, Wet Prep HPF POC FEW (*) NONE SEEN   Comment: MODERATE BACTERIA SEEN   [place] Ob Comp Less 14 Wks  02/20/2014   CLINICAL DATA:  Vaginal bleeding.  Pelvic pain and cramping.  EXAM: OBSTETRIC <14 WK ULTRASOUND  TECHNIQUE: Transabdominal ultrasound was performed for evaluation of the gestation as well as the maternal uterus and adnexal regions.  COMPARISON:  None.  FINDINGS: Intrauterine gestational sac: Visualized/normal in shape.  Yolk sac:  Not visualized  Embryo:  Visualized  Cardiac Activity: Visualized  Heart Rate: 169 bpm  CRL:   42  mm   11 w 1 d                  US EDC: 09/10/2014  Maternal uterus/adnexae: Small subchorionic hemorrhage noted. No mass or free fluid identified. Both ovaries are normal in appearance.   IMPRESSION: Single living IUP measuring 11 weeks 1 day with US EDC of 09/10/2014.  No significant maternal uterine or adnexal abnormality identified.   Electronically Signed   By: Myles RosenthalJohn  Stahl M.D.   On: 02/20/2014 14:02   Review of Systems  Constitutional: Negative for fever and chills.  Gastrointestinal: Negative for nausea, vomiting, abdominal pain, diarrhea and constipation.  Genitourinary: Negative for dysuria, urgency, frequency and hematuria.       + vaginal discharge; thick and white.  + vaginal bleeding; scant No dysuria.    Physical Exam   Blood pressure 140/82, pulse 84, temperature 98.6 F (37 C), temperature source Oral, resp. rate 18, height 5\' 4"  (1.626 m), weight 85.73 kg (189 lb), last menstrual period 12/09/2013.  Physical Exam  Constitutional: She appears well-developed and well-nourished. No distress.  HENT:  Head: Normocephalic.  Eyes: Pupils are equal, round, and reactive to light.  Neck: Neck supple.  Respiratory: Effort normal.  GI: Soft. She exhibits no distension. There is no tenderness. There is no rebound and no guarding.  Genitourinary:  Speculum exam: Vagina - Small amount of creamy, thin discharge, no odor Cervix - scant contact bleeding  Bimanual exam: Cervix closed Uterus non tender, enlarged.  Adnexa non tender, no masses bilaterally GC/Chlam, wet prep done Chaperone present for exam.   Musculoskeletal: Normal range of motion.  Neurological: She is alert.  Skin: Skin is warm. She is not diaphoretic.  Psychiatric: Her behavior is normal.  MAU Course  Procedures None  MDM UA UPT Wet prep GC US B positive blood type   Assessment and Plan   A:  IUP at 11w 1d Vaginal bleeding in pregnancy Subchorionic hemorrhage   P:  Discharge home in stable condition Return to MAU as needed, if symptoms worsen Bleeding precautions discussed Pelvic rest Start prenatal care ASAP  Iona HansenJennifer Irene Calub Tarnow, NP  02/20/2014, 2:46 PM

## 2014-02-21 LAB — GC/CHLAMYDIA PROBE AMP
CT Probe RNA: NEGATIVE
GC Probe RNA: NEGATIVE

## 2014-02-21 NOTE — MAU Provider Note (Signed)
Attestation of Attending Supervision of Advanced Practitioner (PA/CNM/NP): Evaluation and management procedures were performed by the Advanced Practitioner under my supervision and collaboration.  I have reviewed the Advanced Practitioner's note and chart, and I agree with the management and plan.  Tenleigh Byer, MD, FACOG Attending Obstetrician & Gynecologist Faculty Practice, [facility]  

## 2014-03-20 ENCOUNTER — Ambulatory Visit (HOSPITAL_COMMUNITY)
Admission: RE | Admit: 2014-03-20 | Discharge: 2014-03-20 | Disposition: A | Discharge status: Home / Self Care | Payer: BC Managed Care – PPO | Source: Ambulatory Visit | Attending: Obstetrics and Gynecology | Admitting: Obstetrics and Gynecology

## 2014-03-20 ENCOUNTER — Other Ambulatory Visit (HOSPITAL_COMMUNITY): Payer: Self-pay | Admitting: Obstetrics and Gynecology

## 2014-03-20 DIAGNOSIS — R58 Hemorrhage, not elsewhere classified: Secondary | ICD-10-CM

## 2014-03-20 DIAGNOSIS — O209 Hemorrhage in early pregnancy, unspecified: Secondary | ICD-10-CM | POA: Insufficient documentation

## 2014-03-20 DIAGNOSIS — Z3689 Encounter for other specified antenatal screening: Secondary | ICD-10-CM | POA: Insufficient documentation

## 2014-08-31 ENCOUNTER — Encounter (HOSPITAL_COMMUNITY): Payer: Self-pay | Admitting: *Deleted

## 2014-12-05 ENCOUNTER — Emergency Department (HOSPITAL_COMMUNITY)
Admission: EM | Admit: 2014-12-05 | Discharge: 2014-12-05 | Disposition: A | Discharge status: Home / Self Care | Payer: Self-pay | Attending: Emergency Medicine | Admitting: Emergency Medicine

## 2014-12-05 ENCOUNTER — Encounter (HOSPITAL_COMMUNITY): Payer: Self-pay | Admitting: Emergency Medicine

## 2014-12-05 DIAGNOSIS — B349 Viral infection, unspecified: Secondary | ICD-10-CM | POA: Insufficient documentation

## 2014-12-05 DIAGNOSIS — Z79899 Other long term (current) drug therapy: Secondary | ICD-10-CM | POA: Insufficient documentation

## 2014-12-05 DIAGNOSIS — Z880 Allergy status to penicillin: Secondary | ICD-10-CM | POA: Insufficient documentation

## 2014-12-05 LAB — RAPID STREP SCREEN (MED CTR MEBANE ONLY): Streptococcus, Group A Screen (Direct): NEGATIVE

## 2014-12-05 MED ORDER — IBUPROFEN 600 MG PO TABS: 600.0000 mg | ORAL_TABLET | Freq: Four times a day (QID) | ORAL | Status: AC | PRN

## 2014-12-05 NOTE — ED Notes (Signed)
Pt reports onset sore throat, fever, chills, and body aches yesterday. Denies N/V/D. No SOB. No chest pain. Oral temp at this time is 98.5.

## 2014-12-05 NOTE — Discharge Instructions (Signed)
Cough, Adult  A cough is a reflex. It helps you clear your throat and airways. A cough can help heal your body. A cough can last 2 or 3 weeks (acute) or may last more than 8 weeks (chronic). Some common causes of a cough can include an infection, allergy, or a cold. HOME CARE  Only take medicine as told by your doctor.  If given, take your medicines (antibiotics) as told. Finish them even if you start to feel better.  Use a cold steam vaporizer or humidifier in your home. This can help loosen thick spit (secretions).  Sleep so you are almost sitting up (semi-upright). Use pillows to do this. This helps reduce coughing.  Rest as needed.  Stop smoking if you smoke. GET HELP RIGHT AWAY IF:  You have yellowish-white fluid (pus) in your thick spit.  Your cough gets worse.  Your medicine does not reduce coughing, and you are losing sleep.  You cough up blood.  You have trouble breathing.  Your pain gets worse and medicine does not help.  You have a fever. MAKE SURE YOU:   Understand these instructions.  Will watch your condition.  Will get help right away if you are not doing well or get worse. Document Released: 06/29/2011 Document Revised: 03/02/2014 Document Reviewed: 06/29/2011 [facility] Patient Information 2015 [place], [place]. This information is not intended to replace advice given to you by your health care provider. Make sure you discuss any questions you have with your health care provider. Your strep test is negative

## 2014-12-05 NOTE — ED Provider Notes (Signed)
CSN: [id number]     Arrival date & time 12/05/14  1911 History   First MD Initiated Contact with Patient 12/05/14 1955     This chart was scribed for non-physician practitioner, Earley FavorGail Errica Dutil, FNP working with Rolan BuccoMelanie Belfi, MD by Arlan OrganAshley Leger, ED Scribe. This patient was seen in room WTR7/WTR7 and the patient's care was started at 8:10 PM.   Chief Complaint  Patient presents with  . Sore Throat   The history is provided by the patient. No language interpreter was used.    HPI Comments: Kiara Bates is a 29 y.o. female without any significant past medical history who presents to the Emergency Department complaining of constant, moderate sore throat onset last night. She also reports subjective fever, cough, chills, and myalgias. She has tried OTC Nyquil without any improvement for symptoms. Last dose last night. No recent nausea, vomiting, diarrhea, CP, or SOB. No known sick contacts. Pt with known allergies to Penicillins.  Past Medical History  Diagnosis Date  . No pertinent past medical history   . Medical history non-contributory    Past Surgical History  Procedure Laterality Date  . Cesarean section     No family history on file. History  Substance Use Topics  . Smoking status: Never Smoker   . Smokeless tobacco: Not on file  . Alcohol Use: No   OB History    Gravida Para Term Preterm AB TAB SAB Ectopic Multiple Living   4 2        2      Review of Systems  Constitutional: Positive for fever (subjective) and chills.  HENT: Positive for sore throat.   Respiratory: Positive for cough. Negative for shortness of breath and wheezing.   Cardiovascular: Negative for chest pain.  Gastrointestinal: Negative for nausea, vomiting and diarrhea.  Musculoskeletal: Positive for myalgias.      Allergies  Penicillins  Home Medications   Prior to Admission medications   Medication Sig Start Date End Date Taking? Authorizing Provider  acetaminophen (TYLENOL) 325 MG tablet Take  650 mg by mouth every 6 (six) hours as needed for mild pain or headache.    Historical Provider, MD  ibuprofen (ADVIL,MOTRIN) 600 MG tablet Take 1 tablet (600 mg total) by mouth every 6 (six) hours as needed. 12/05/14   Arman FilterGail K Denicia Pagliarulo, NP  Multiple Vitamin (MULTIVITAMIN WITH MINERALS) TABS tablet Take 1 tablet by mouth daily.    Historical Provider, MD   Triage Vitals: BP 139/82 mmHg  Pulse 79  Temp(Src) 98.5 F (36.9 C) (Oral)  Resp 16  Ht 5\' 6"  (1.676 m)  SpO2 100%  LMP 10/19/2014   Physical Exam  Constitutional: She is oriented to person, place, and time. She appears well-developed and well-nourished.  HENT:  Head: Normocephalic.  Right Ear: Hearing, tympanic membrane, external ear and ear canal normal.  Left Ear: Hearing, tympanic membrane, external ear and ear canal normal.  Nose: Nose normal.  Mouth/Throat: Uvula is midline, oropharynx is clear and moist and mucous membranes are normal.  Eyes: EOM are normal. Pupils are equal, round, and reactive to light.  Neck: Normal range of motion.  Cardiovascular: Normal rate and regular rhythm.   Pulmonary/Chest: Effort normal. No respiratory distress. She has no wheezes. She exhibits no tenderness.  Musculoskeletal: Normal range of motion.  Lymphadenopathy:    She has no cervical adenopathy.  Neurological: She is alert and oriented to person, place, and time.  Psychiatric: She has a normal mood and affect.  Nursing note  and vitals reviewed.   ED Course  Procedures (including critical care time)  DIAGNOSTIC STUDIES: Oxygen Saturation is 100% on RA, Normal by my interpretation.    COORDINATION OF CARE: 8:13 PM-Discussed treatment plan with pt at bedside and pt agreed to plan.     Labs Review Labs Reviewed  RAPID STREP SCREEN  CULTURE, GROUP A STREP    Imaging Review No results found.   EKG Interpretation None      MDM   Final diagnoses:  Viral syndrome    I personally performed the services described in this  documentation, which was scribed in my presence. The recorded information has been reviewed and is accurate.  Arman Filter, NP 12/05/14 2138  Rolan Bucco, MD 12/05/14 [phone]

## 2014-12-08 LAB — CULTURE, GROUP A STREP

## 2014-12-26 ENCOUNTER — Encounter (HOSPITAL_COMMUNITY): Payer: Self-pay | Admitting: *Deleted

## 2015-04-28 IMAGING — US US OB COMP LESS 14 WK
1 series · 14 of 28 positions shown · non-contrast
Comparison: None.

CLINICAL DATA: Vaginal bleeding.  Pelvic pain and cramping.

EXAM:
OBSTETRIC <14 WK ULTRASOUND
TECHNIQUE: Transabdominal ultrasound was performed for evaluation of the
gestation as well as the maternal uterus and adnexal regions.

[Series 1: us ob comp less 14 wks · 14 of 43 slices shown]
[im 2/43]
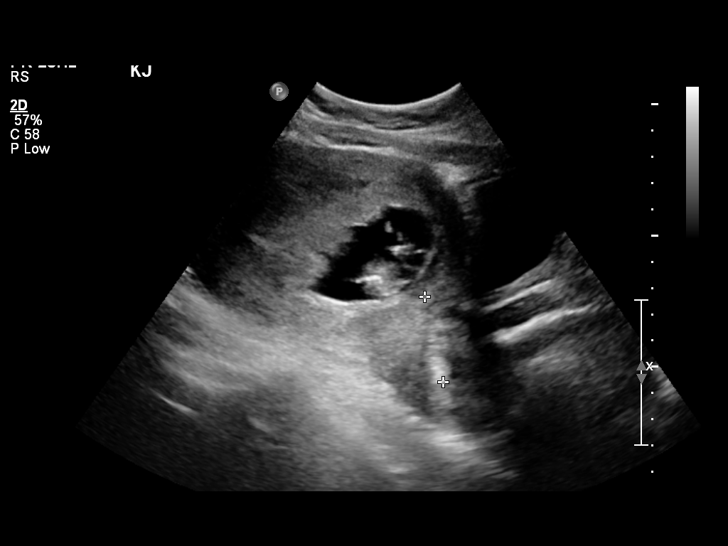
[im 5/43]
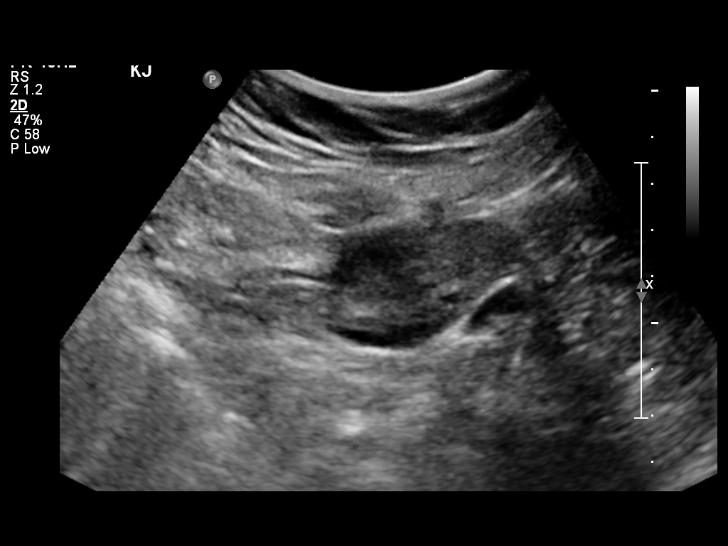
[im 8/43]
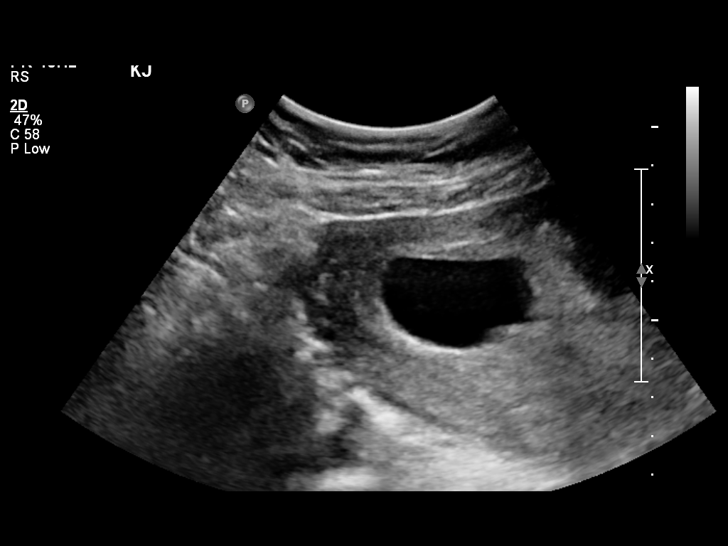
[im 11/43]
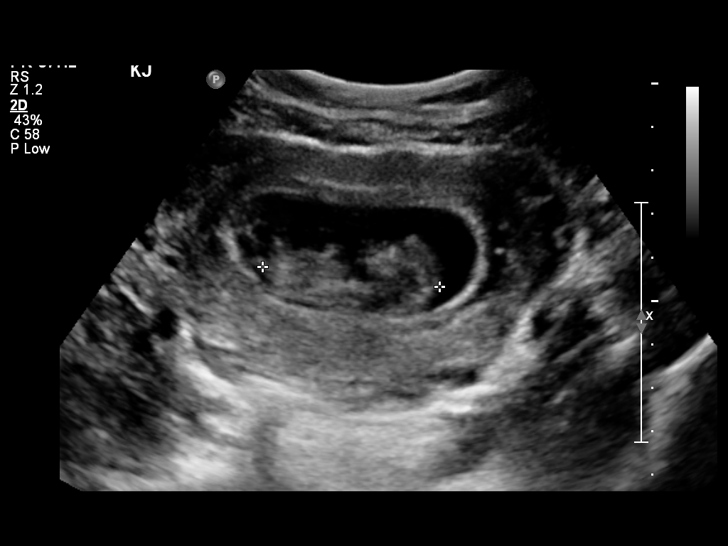
[im 15/43]
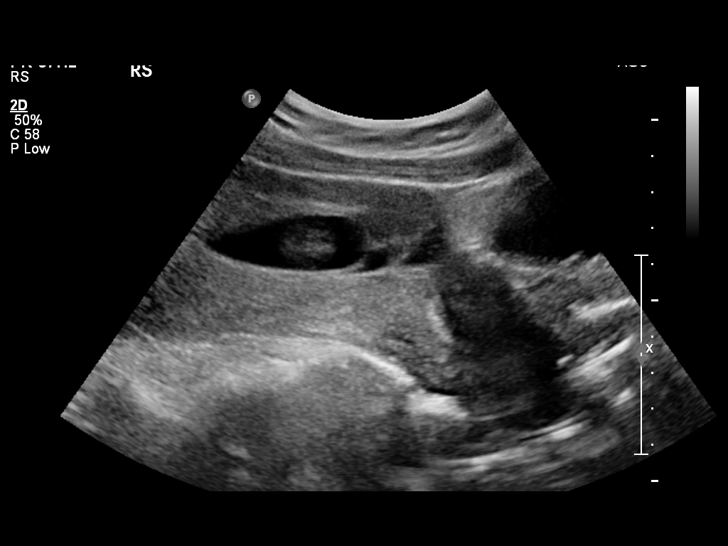
[im 18/43]
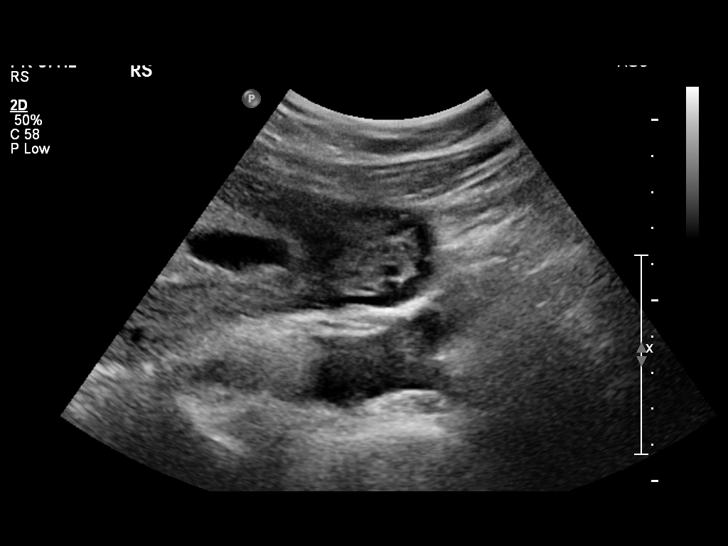
[im 21/43]
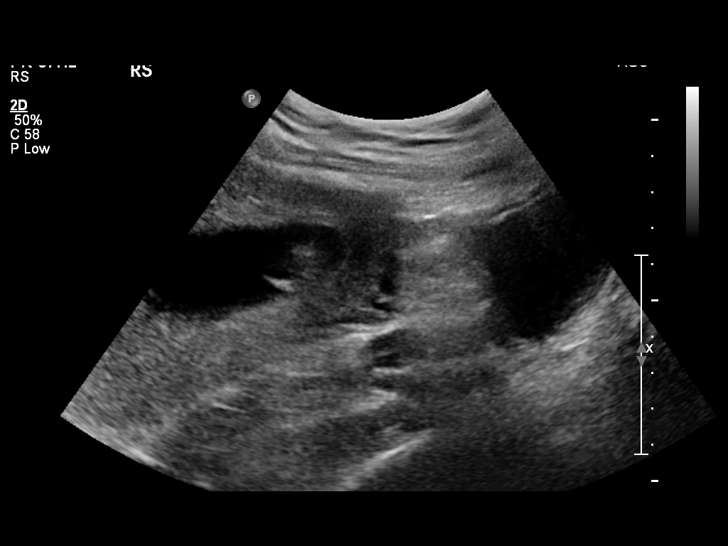
[im 24/43]
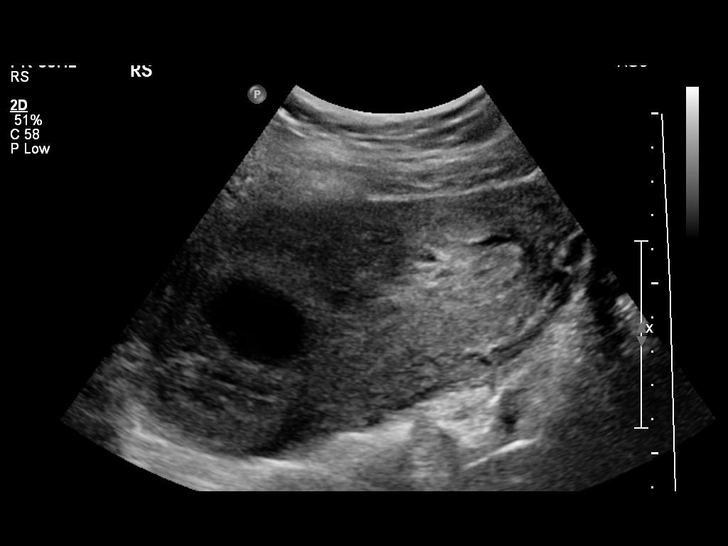
[im 27/43]
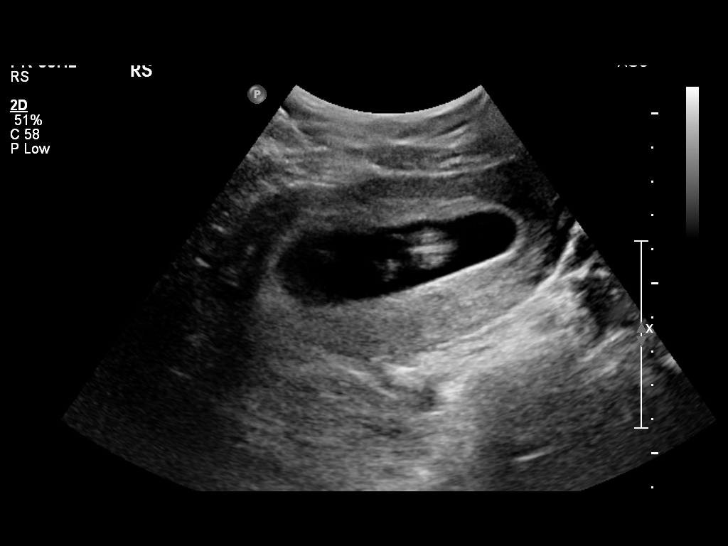
[im 30/43]
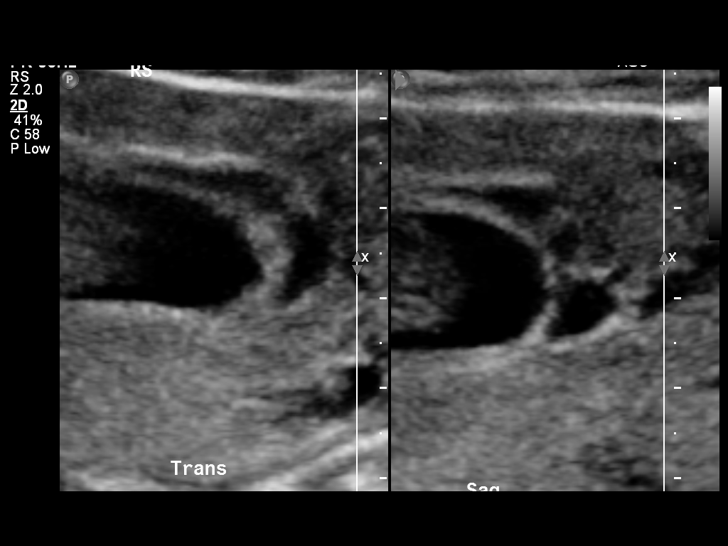
[im 33/43]
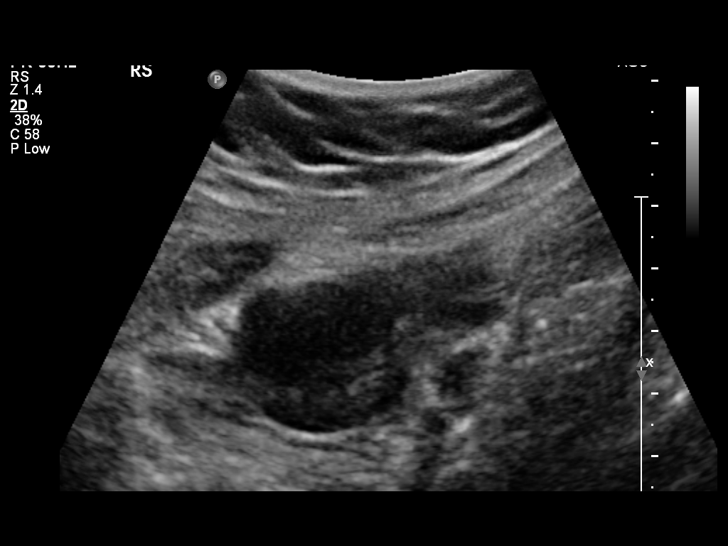
[im 36/43]
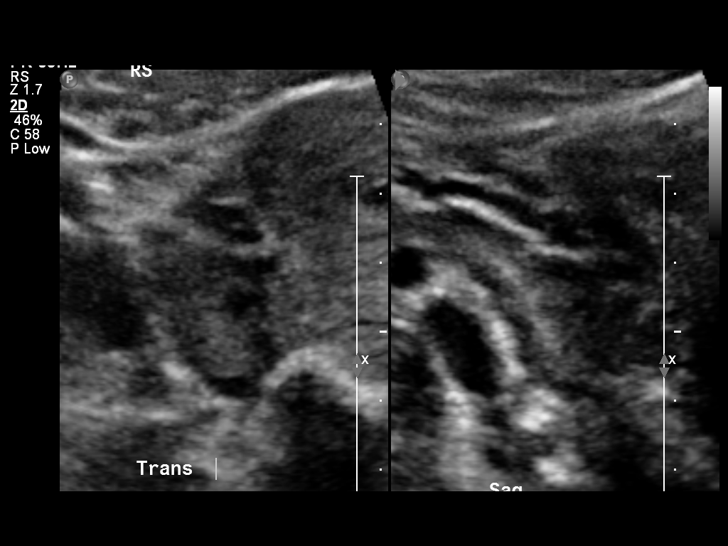
[im 39/43]
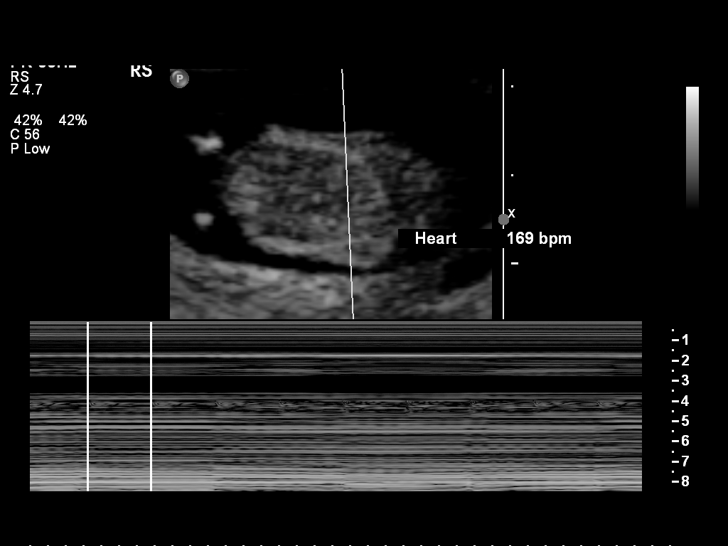
[im 43/43]
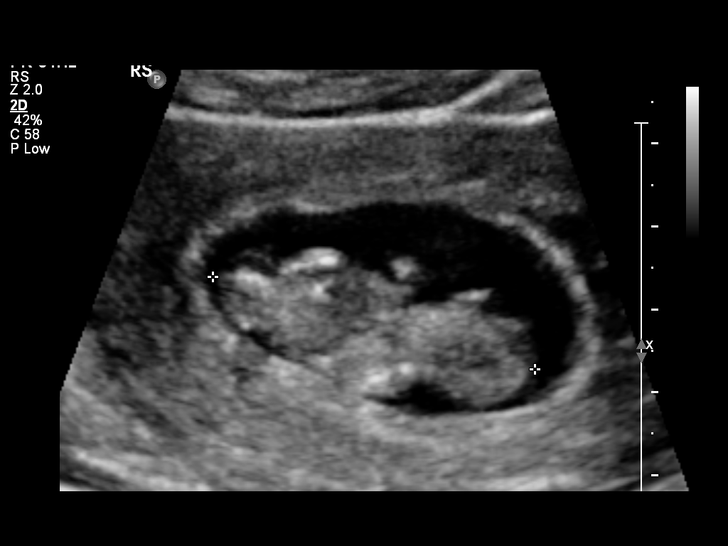

[14 of 28 positions shown; findings below may reference images not displayed]

FINDINGS: Intrauterine gestational sac: Visualized/normal in shape.

Yolk sac:  Not visualized

Embryo:  Visualized

Cardiac Activity: Visualized

Heart Rate: 169 bpm

CRL:   42  mm   11 w 1 d                  US EDC: 09/10/2014

Maternal uterus/adnexae: Small subchorionic hemorrhage noted. No
mass or free fluid identified. Both ovaries are normal in
appearance.
IMPRESSION: Single living IUP measuring 11 weeks 1 day with US EDC of
09/10/2014.

No significant maternal uterine or adnexal abnormality identified.

## 2015-06-02 ENCOUNTER — Encounter (HOSPITAL_COMMUNITY): Payer: Self-pay | Admitting: Emergency Medicine

## 2015-06-02 ENCOUNTER — Emergency Department (HOSPITAL_COMMUNITY)
Admission: EM | Admit: 2015-06-02 | Discharge: 2015-06-02 | Disposition: A | Discharge status: Home / Self Care | Payer: Self-pay | Attending: Emergency Medicine | Admitting: Emergency Medicine

## 2015-06-02 DIAGNOSIS — Z79899 Other long term (current) drug therapy: Secondary | ICD-10-CM | POA: Insufficient documentation

## 2015-06-02 DIAGNOSIS — H11221 Conjunctival granuloma, right eye: Secondary | ICD-10-CM | POA: Insufficient documentation

## 2015-06-02 DIAGNOSIS — Z880 Allergy status to penicillin: Secondary | ICD-10-CM | POA: Insufficient documentation

## 2015-06-02 MED ORDER — ARTIFICIAL TEARS OP OINT: TOPICAL_OINTMENT | Freq: Three times a day (TID) | OPHTHALMIC | Status: AC

## 2015-06-02 NOTE — Discharge Instructions (Signed)
Follow up with Ophthalmologist.  Call the office in the morning and let them know that you were seen in the Emergency Department and that the provider in the ER had talked to Dr. Randon Goldsmith.  Use artificial tears and also the ointment that you were given to lubricate the eye.

## 2015-06-02 NOTE — ED Notes (Addendum)
Pt reports right eye pain and irritation. Has what appear to be multiple sty-like areas on the conjunctiva. No redness or drainage noted. Denies recent injury/trauma/infection. C/o blurry vision in the mornings and clear drainage and swelling in the morning. No other c/c.

## 2015-06-02 NOTE — ED Provider Notes (Signed)
CSN: [id number]     Arrival date & time 06/02/15  1854 History  This chart was scribed for non-physician practitioner, Santiago Glad, PA-C working with Rolland Porter, MD by Doreatha Martin, ED scribe. This patient was seen in room WTR9/WTR9 and the patient's care was started at 7:36 PM    Chief Complaint  Patient presents with  . Eye Pain   The history is provided by the patient. No language interpreter was used.    HPI Comments: Kiara Bates is a 29 y.o. female who presents to the Emergency Department complaining of moderate eye redness onset one week ago and worsened yesterday. Pt states associated tearing onset a week ago, swelling of the eye, blurry vision (since improved), multiple bumps on the right eye and foreign body sensation. She states no sick contact with people that have eye infections. Pt states that she believed it was allergies or pink eye but notes that redness and swelling gets better in the afternoons. She does not wear contact lenses. She denies any thick discharge. No fevers or chills.  Past Medical History  Diagnosis Date  . No pertinent past medical history   . Medical history non-contributory    Past Surgical History  Procedure Laterality Date  . Cesarean section     History reviewed. No pertinent family history. History  Substance Use Topics  . Smoking status: Never Smoker   . Smokeless tobacco: Not on file  . Alcohol Use: No   OB History    Gravida Para Term Preterm AB TAB SAB Ectopic Multiple Living   4 2        2      Review of Systems  Constitutional: Negative for fever and chills.  Eyes: Positive for pain, discharge ( tearing), redness and visual disturbance.       Bumps on the right eye  Skin: Negative for color change.   Allergies  Penicillins  Home Medications   Prior to Admission medications   Medication Sig Start Date End Date Taking? Authorizing Provider  acetaminophen (TYLENOL) 325 MG tablet Take 650 mg by mouth every 6 (six) hours as  needed for mild pain or headache.    Historical Provider, MD  ibuprofen (ADVIL,MOTRIN) 600 MG tablet Take 1 tablet (600 mg total) by mouth every 6 (six) hours as needed. 12/05/14   Earley Favor, NP  Multiple Vitamin (MULTIVITAMIN WITH MINERALS) TABS tablet Take 1 tablet by mouth daily.    Historical Provider, MD   BP 125/85 mmHg  Pulse 70  Temp(Src) 98.9 F (37.2 C) (Oral)  Resp 17  SpO2 100%  LMP 06/02/2015 (Exact Date)  Breastfeeding? No Physical Exam  Constitutional: She is oriented to person, place, and time. She appears well-developed and well-nourished.  HENT:  Head: Normocephalic and atraumatic.  Eyes: EOM are normal. Pupils are equal, round, and reactive to light. Right eye exhibits no discharge and no exudate. Left eye exhibits no discharge and no exudate. Right conjunctiva is not injected. Left conjunctiva is not injected.  3 raised beefy red areas of the palpebral conjunctiva of the right eye that look like granular tissue.    Neck: Normal range of motion. Neck supple.  Cardiovascular: Normal rate and regular rhythm.   Pulmonary/Chest: Effort normal and breath sounds normal. No respiratory distress.  Abdominal: She exhibits no distension.  Musculoskeletal: Normal range of motion.  Neurological: She is alert and oriented to person, place, and time.  Skin: Skin is warm and dry.  Psychiatric: She has a normal  mood and affect. Her behavior is normal.  Nursing note and vitals reviewed.  ED Course  Procedures (including critical care time) DIAGNOSTIC STUDIES: Oxygen Saturation is 100% on RA, normal by my interpretation.    COORDINATION OF CARE: 7:40 PM Discussed treatment plan with pt at bedside and pt agreed to plan.   7:46 PM Consulted with Dr. Fayrene Fearing, who agreed to come evaluate the pt.   Labs Review Labs Reviewed - No data to display  Imaging Review No results found.   EKG Interpretation None      8:15 PM Discussed with Dr. Randon Goldsmith with Ophthalmology.  He thinks it  sounds like a pyogenic granuloma.  He recommends giving the patient lubricating ointment and states that he will see her in the office tomorrow.   MDM   Final diagnoses:  None  Patient presents today with a raised mass of the palpebral conjunctiva of the right eye x 1.5 weeks.  She denies vision changes.  Discussed with Ophthalmology who recommends applying lubricating ointment and will follow up with the patient in the office tomorrow.  Return precautions given.   I personally performed the services described in this documentation, which was scribed in my presence. The recorded information has been reviewed and is accurate.    Santiago Glad, PA-C 06/04/15 1512  Rolland Porter, MD 06/08/15 [phone]

## 2021-10-04 ENCOUNTER — Encounter (HOSPITAL_BASED_OUTPATIENT_CLINIC_OR_DEPARTMENT_OTHER): Payer: Self-pay | Admitting: Emergency Medicine

## 2021-10-04 ENCOUNTER — Emergency Department (HOSPITAL_BASED_OUTPATIENT_CLINIC_OR_DEPARTMENT_OTHER)
Admission: EM | Admit: 2021-10-04 | Discharge: 2021-10-04 | Disposition: A | Discharge status: Home / Self Care | Payer: Medicaid Other | Attending: Emergency Medicine | Admitting: Emergency Medicine

## 2021-10-04 ENCOUNTER — Other Ambulatory Visit: Payer: Self-pay

## 2021-10-04 DIAGNOSIS — R102 Pelvic and perineal pain: Secondary | ICD-10-CM

## 2021-10-04 DIAGNOSIS — D72829 Elevated white blood cell count, unspecified: Secondary | ICD-10-CM | POA: Insufficient documentation

## 2021-10-04 LAB — URINALYSIS, ROUTINE W REFLEX MICROSCOPIC
Bilirubin Urine: NEGATIVE
Glucose, UA: NEGATIVE mg/dL
Hgb urine dipstick: NEGATIVE
Ketones, ur: NEGATIVE mg/dL
Nitrite: NEGATIVE
Specific Gravity, Urine: 1.028 (ref 1.005–1.030)
pH: 6 (ref 5.0–8.0)

## 2021-10-04 LAB — PREGNANCY, URINE: Preg Test, Ur: NEGATIVE

## 2021-10-04 MED ORDER — NAPROXEN 500 MG PO TABS: 500.0000 mg | ORAL_TABLET | Freq: Two times a day (BID) | ORAL | 0 refills | Status: AC

## 2021-10-04 MED ORDER — NAPROXEN 500 MG PO TABS: 500.0000 mg | ORAL_TABLET | Freq: Two times a day (BID) | ORAL | 0 refills | Status: DC

## 2021-10-04 NOTE — ED Provider Notes (Signed)
[facility] EMERGENCY DEPT Provider Note   CSN: [id number] Arrival date & time: 10/04/21  9563     History Chief Complaint  Patient presents with   Pelvic Pain    Kiara Bates is a 35 y.o. female.  35 year old female with a past medical history of recurrent pelvic pain presents to the ED today with worsening pelvic pain since yesterday.  According to patient, symptoms have been ongoing since she had her C-section 13 years ago.  Reports she had a negative ultrasound back then, which showed fibroids.  She is here today for worsening pain, describing it as unbearable shooting pain like lightning bolts exacerbated with walking.  Takes over-the-counter ibuprofen, Tylenol but does not help with some of the pain.  She does report being sexually active but not several months ago, is not concerned for any sexually transmitted infections.  She is currently on the Depo-Provera.  Does have a family history of ongoing uterine fibroids.  He is without any nausea, vomiting, vaginal discharge, vaginal bleeding.    The history is provided by the patient and medical records.  Pelvic Pain This is a recurrent problem. The current episode started more than 1 week ago. The problem occurs constantly. The problem has been gradually worsening. Pertinent negatives include no chest pain, no abdominal pain and no shortness of breath.      Past Medical History:  Diagnosis Date   Medical history non-contributory    No pertinent past medical history     There are no problems to display for this patient.   Past Surgical History:  Procedure Laterality Date   CESAREAN SECTION       OB History     Gravida  4   Para  2   Term      Preterm      AB      Living  2      SAB      IAB      Ectopic      Multiple      Live Births              No family history on file.  Social History   Tobacco Use   Smoking status: Never  Substance Use Topics   Alcohol use: No    Drug use: No    Home Medications Prior to Admission medications   Medication Sig Start Date End Date Taking? Authorizing Provider  naproxen (NAPROSYN) 500 MG tablet Take 1 tablet (500 mg total) by mouth 2 (two) times daily for 7 days. 10/04/21 10/11/21 Yes Desira Alessandrini, Leonie Douglas, PA-C  acetaminophen (TYLENOL) 325 MG tablet Take 650 mg by mouth every 6 (six) hours as needed for mild pain or headache.    [provider]  artificial tears (LACRILUBE) OINT ophthalmic ointment Place into the right eye 3 (three) times daily. 06/02/15   Santiago Glad, PA-C  ibuprofen (ADVIL,MOTRIN) 600 MG tablet Take 1 tablet (600 mg total) by mouth every 6 (six) hours as needed. 12/05/14   Earley Favor, NP  Multiple Vitamin (MULTIVITAMIN WITH MINERALS) TABS tablet Take 1 tablet by mouth daily.    [provider]    Allergies    Penicillins  Review of Systems   Review of Systems  Constitutional:  Negative for fever.  HENT:  Negative for sore throat.   Respiratory:  Negative for shortness of breath.   Cardiovascular:  Negative for chest pain.  Gastrointestinal:  Negative for abdominal pain, nausea and vomiting.  Genitourinary:  Positive for pelvic pain. Negative for difficulty urinating.  Musculoskeletal:  Negative for back pain.  All other systems reviewed and are negative.  Physical Exam Updated Vital Signs BP (!) 133/91 (BP Location: Right Arm)   Pulse 89   Temp 98.7 F (37.1 C) (Oral)   Resp 18   Ht 5\' 5"  (1.651 m)   Wt 97.1 kg   SpO2 99%   BMI 35.61 kg/m   Physical Exam Vitals and nursing note reviewed.  Constitutional:      Appearance: Normal appearance.  HENT:     Head: Normocephalic and atraumatic.     Mouth/Throat:     Mouth: Mucous membranes are moist.  Eyes:     Pupils: Pupils are equal, round, and reactive to light.  Cardiovascular:     Rate and Rhythm: Normal rate.  Pulmonary:     Effort: Pulmonary effort is normal.  Abdominal:     General: Abdomen is flat.      Palpations: Abdomen is soft.     Tenderness: There is no abdominal tenderness. There is no right CVA tenderness or left CVA tenderness.     Comments: Abdomen is soft, bowel sounds throughout, no focal point of tenderness.  No tenderness with palpation.  Musculoskeletal:     Cervical back: Normal range of motion and neck supple.  Skin:    General: Skin is dry.  Neurological:     Mental Status: She is oriented to person, place, and time.    ED Results / Procedures / Treatments   Labs (all labs ordered are listed, but only abnormal results are displayed) Labs Reviewed  URINALYSIS, ROUTINE W REFLEX MICROSCOPIC - Abnormal; Notable for the following components:      Result Value   Protein, ur TRACE (*)    Leukocytes,Ua SMALL (*)    All other components within normal limits  PREGNANCY, URINE    EKG None  Radiology No results found.  Procedures Procedures   Medications Ordered in ED Medications - No data to display  ED Course  I have reviewed the triage vital signs and the nursing notes.  Pertinent labs & imaging results that were available during my care of the patient were reviewed by me and considered in my medical decision making (see chart for details).  Clinical Course as of 10/04/21 1243  Tue Oct 04, 2021  1226 Dec 8, 2022Glori Luis): SMALL [JS]    Clinical Course User Index [JS] Marland Kitchen, PA-C   MDM Rules/Calculators/A&P   Presents to the ED with recurrent pelvic pain that is been ongoing for the past 13 years.  Some relief with over-the-counter medication, however reports ibuprofen extra strength 800 mg works best.  On evaluation she is nontoxic-appearing, vitals are within normal limits, she denies any nausea, vomiting, fever.  There is no CVA tenderness, no urinary symptoms.  UA with small amount of bacteria, she does not have any concern for sexually transmitted infection as she has not been sexually active in several months.  We discussed pelvic exam in order to  further testing, she deferred this at this time.  She is inquiring about Pap smear, which we currently do not do in the emergency department.  Seeing as her exam is unremarkable, we discussed appropriate outpatient follow-up.  She is agreeable of this at this time.  I have a very low suspicion for torsion, pain has been ongoing for 10 years.  No urinary symptoms, lower suspicion for UTI with a clean urine.  CVA tenderness, lower  solution for Pyelo  Abdomen is soft.  Understands and agrees to management at this time, return precautions discussed at length.  Patient stable for discharge.  Portions of this note were generated with Scientist, clinical (histocompatibility and immunogenetics). Dictation errors may occur despite best attempts at proofreading.  Final Clinical Impression(s) / ED Diagnoses Final diagnoses:  Pelvic pain    Rx / DC Orders ED Discharge Orders          Ordered    naproxen (NAPROSYN) 500 MG tablet  2 times daily        10/04/21 1237             Claude Manges, PA-C 10/04/21 1243    Sloan Leiter, DO 10/05/21 1642

## 2021-10-04 NOTE — ED Triage Notes (Signed)
Reports sharp pelvic pains.  Hx of the same.  Seen previously and told it was scar tissue.  Reports the pain has gotten worse recently.  Denies any discharge.  On depo.  Does not have a period.

## 2021-10-04 NOTE — Discharge Instructions (Addendum)
I have prescribed a short course of anti-inflammatories to help with your pain.  Please take 1 tablet twice a day with food for the next 7 days.  The phone number to [facility] is attached to your paperwork, please call to schedule an appointment in order to have further follow-up for your recurrent fibroids.

## 2021-10-13 ENCOUNTER — Other Ambulatory Visit: Payer: Self-pay | Admitting: Physician Assistant

## 2021-10-13 DIAGNOSIS — R102 Pelvic and perineal pain: Secondary | ICD-10-CM

## 2021-10-14 ENCOUNTER — Other Ambulatory Visit: Payer: Self-pay | Admitting: Physician Assistant

## 2021-10-14 DIAGNOSIS — R102 Pelvic and perineal pain: Secondary | ICD-10-CM

## 2021-10-20 ENCOUNTER — Other Ambulatory Visit: Payer: Self-pay

## 2021-10-20 ENCOUNTER — Ambulatory Visit
Admission: RE | Admit: 2021-10-20 | Discharge: 2021-10-20 | Disposition: A | Discharge status: Home / Self Care | Payer: Medicaid Other | Source: Ambulatory Visit | Attending: Physician Assistant | Admitting: Physician Assistant

## 2021-10-20 DIAGNOSIS — R102 Pelvic and perineal pain: Secondary | ICD-10-CM

## 2022-01-18 NOTE — Progress Notes (Deleted)
? ? ?GYNECOLOGY ANNUAL PHYSICAL EXAM PROGRESS NOTE ? ?Subjective:  ? ? Kiara Bates is a 36 y.o. G4P2 female who presents for an annual exam. The patient has no complaints today. The patient {is/is not/has never been:13135} sexually active. The patient participates in regular exercise: {yes/no/not asked:9010}. Has the patient ever been transfused or tattooed?: {yes/no/not asked:9010}. The patient reports that there {is/is not:9024} domestic violence in her life.  ? ? ?Menstrual History: ?Menarche age: *** ?No LMP recorded. (Menstrual status: Other). ?  ? ? ?Gynecologic History:  ?Contraception: {method:5051} ?History of STI's:  ?Last Pap: ***. Results were: {norm/abn:16337}.  ***Denies/Notes h/o abnormal pap smears. ?Last mammogram: ***. Results were: {norm/abn:16337} ? ? ? ? ? ? ?OB History  ?Gravida Para Term Preterm AB Living  ?4 2 0 0 0 2  ?SAB IAB Ectopic Multiple Live Births  ?0 0 0 0 0  ?  ?# Outcome Date GA Lbr Len/2nd Weight Sex Delivery Anes PTL Lv  ?4 Gravida           ?3 Gravida           ?   Birth Comments: System Generated. Please review and update pregnancy details.  ?2 Para           ?1 Para           ? ? ?Past Medical History:  ?Diagnosis Date  ? Medical history non-contributory   ? No pertinent past medical history   ? ? ?Past Surgical History:  ?Procedure Laterality Date  ? CESAREAN SECTION    ? ? ?No family history on file. ? ?Social History  ? ?Socioeconomic History  ? Marital status: Single  ?  Spouse name: Not on file  ? Number of children: Not on file  ? Years of education: Not on file  ? Highest education level: Not on file  ?Occupational History  ? Not on file  ?Tobacco Use  ? Smoking status: Never  ? Smokeless tobacco: Not on file  ?Substance and Sexual Activity  ? Alcohol use: No  ? Drug use: No  ? Sexual activity: Yes  ?Other Topics Concern  ? Not on file  ?Social History Narrative  ? Not on file  ? ?Social Determinants of Health  ? ?Financial Resource Strain: Not on file  ?Food  Insecurity: Not on file  ?Transportation Needs: Not on file  ?Physical Activity: Not on file  ?Stress: Not on file  ?Social Connections: Not on file  ?Intimate Partner Violence: Not on file  ? ? ?Current Outpatient Medications on File Prior to Visit  ?Medication Sig Dispense Refill  ? acetaminophen (TYLENOL) 325 MG tablet Take 650 mg by mouth every 6 (six) hours as needed for mild pain or headache.    ? artificial tears (LACRILUBE) OINT ophthalmic ointment Place into the right eye 3 (three) times daily. 3.5 g 0  ? ibuprofen (ADVIL,MOTRIN) 600 MG tablet Take 1 tablet (600 mg total) by mouth every 6 (six) hours as needed. 30 tablet 0  ? Multiple Vitamin (MULTIVITAMIN WITH MINERALS) TABS tablet Take 1 tablet by mouth daily.    ? ?No current facility-administered medications on file prior to visit.  ? ? ?Allergies  ?Allergen Reactions  ? Penicillins Rash  ? ? ? ?Review of Systems ?Constitutional: negative for chills, fatigue, fevers and sweats ?Eyes: negative for irritation, redness and visual disturbance ?Ears, nose, mouth, throat, and face: negative for hearing loss, nasal congestion, snoring and tinnitus ?Respiratory: negative for asthma, cough, sputum ?  Cardiovascular: negative for chest pain, dyspnea, exertional chest pressure/discomfort, irregular heart beat, palpitations and syncope ?Gastrointestinal: negative for abdominal pain, change in bowel habits, nausea and vomiting ?Genitourinary: negative for abnormal menstrual periods, genital lesions, sexual problems and vaginal discharge, dysuria and urinary incontinence ?Integument/breast: negative for breast lump, breast tenderness and nipple discharge ?Hematologic/lymphatic: negative for bleeding and easy bruising ?Musculoskeletal:negative for back pain and muscle weakness ?Neurological: negative for dizziness, headaches, vertigo and weakness ?Endocrine: negative for diabetic symptoms including polydipsia, polyuria and skin dryness ?Allergic/Immunologic: negative  for hay fever and urticaria    ? ? ?Objective:  ?There were no vitals taken for this visit. There is no height or weight on file to calculate BMI. ? ?  ?General Appearance:    Alert, cooperative, no distress, appears stated age  ?Head:    Normocephalic, without obvious abnormality, atraumatic  ?Eyes:    PERRL, conjunctiva/corneas clear, EOM's intact, both eyes  ?Ears:    Normal external ear canals, both ears  ?Nose:   Nares normal, septum midline, mucosa normal, no drainage or sinus tenderness  ?Throat:   Lips, mucosa, and tongue normal; teeth and gums normal  ?Neck:   Supple, symmetrical, trachea midline, no adenopathy; thyroid: no enlargement/tenderness/nodules; no carotid bruit or JVD  ?Back:     Symmetric, no curvature, ROM normal, no CVA tenderness  ?Lungs:     Clear to auscultation bilaterally, respirations unlabored  ?Chest Wall:    No tenderness or deformity  ? Heart:    Regular rate and rhythm, S1 and S2 normal, no murmur, rub or gallop  ?Breast Exam:    No tenderness, masses, or nipple abnormality  ?Abdomen:     Soft, non-tender, bowel sounds active all four quadrants, no masses, no organomegaly.    ?Genitalia:    Pelvic:external genitalia normal, vagina without lesions, discharge, or tenderness, rectovaginal septum  normal. Cervix normal in appearance, no cervical motion tenderness, no adnexal masses or tenderness.  Uterus normal size, shape, mobile, regular contours, nontender.  ?Rectal:    Normal external sphincter.  No hemorrhoids appreciated. Internal exam not done.   ?Extremities:   Extremities normal, atraumatic, no cyanosis or edema  ?Pulses:   2+ and symmetric all extremities  ?Skin:   Skin color, texture, turgor normal, no rashes or lesions  ?Lymph nodes:   Cervical, supraclavicular, and axillary nodes normal  ?Neurologic:   CNII-XII intact, normal strength, sensation and reflexes throughout  ? ?. ? ?Labs:  ?Lab Results  ?Component Value Date  ? WBC 9.4 02/20/2014  ? HGB 11.8 (L) 02/20/2014  ?  HCT 34.6 (L) 02/20/2014  ? MCV 92.5 02/20/2014  ? PLT 317 02/20/2014  ? ? ?No results found for: CREATININE, BUN, NA, K, CL, CO2 ? ?No results found for: ALT, AST, GGT, ALKPHOS, BILITOT ? ?No results found for: TSH ? ? ?Assessment:  ? ?No diagnosis found. ?  ?Plan:  ?Blood tests: {blood tests:13147}. ?Breast self exam technique reviewed and patient encouraged to perform self-exam monthly. ?Contraception: {contraceptive methods:5051}. ?Discussed healthy lifestyle modifications. ?Mammogram {discussed/ordered:14545} ?Pap smear {discussed/ordered:14545}. ?COVID vaccination status: ?Follow up in 1 year for annual exam ? ? ?Alley Neils L, CMA ?[facility] ?

## 2022-01-19 ENCOUNTER — Encounter: Payer: Self-pay | Admitting: Obstetrics and Gynecology

## 2022-09-14 NOTE — Progress Notes (Deleted)
New Kiara Bates Note  RE: Kiara Bates MRN: [id number] DOB: 1986-03-23 Date of Office Visit: 09/15/2022  Consult requested by: Shara Blazing, FNP Primary care provider: Patient, No Pcp Per  Chief Complaint: No chief complaint on file.  History of Present Illness: I had the pleasure of seeing Kiara Bates for initial evaluation at [facility]  on 09/14/2022. She is a 36 y.o. female, who is referred here by Kiara Bates, No Pcp Per for the evaluation of swelling.  Swelling started about *** ago. Mainly occurs on her ***. Describes them as ***. Individual swelling episodes last about ***. No ecchymosis upon resolution. Associated symptoms include: ***.  Frequency of episodes: ***. Suspected triggers are ***. Denies any *** fevers, chills, changes in medications, foods, personal care products or recent infections. She has tried the following therapies: *** with *** benefit. Systemic steroids ***. Currently on ***.  Previous work up includes: ***. Previous history of swelling: {Blank single:19197::"yes","no"}. Family history of angioedema: {Blank single:19197::"yes","no"}. Kiara Bates is up to date with the following cancer screening tests: ***. Ace-inhibitor use: {Blank single:19197::"yes","no"}  Assessment and Plan: Oriyah is a 36 y.o. female with: No problem-specific Assessment & Plan notes found for this encounter.  No follow-ups on file.  No orders of the defined types were placed in this encounter.  Lab Orders  No laboratory test(s) ordered today    Other allergy screening: Asthma: {Blank single:19197::"yes","no"} Rhino conjunctivitis: {Blank single:19197::"yes","no"} Food allergy: {Blank single:19197::"yes","no"} Medication allergy: {Blank single:19197::"yes","no"} Hymenoptera allergy: {Blank single:19197::"yes","no"} Urticaria: {Blank single:19197::"yes","no"} Eczema:{Blank single:19197::"yes","no"} History of recurrent infections suggestive of  immunodeficency: {Blank single:19197::"yes","no"}  Diagnostics: Spirometry:  Tracings reviewed. Her effort: {Blank single:19197::"Good reproducible efforts.","It was hard to get consistent efforts and there is a question as to whether this reflects a maximal maneuver.","Poor effort, data can not be interpreted."} FVC: ***L FEV1: ***L, ***% predicted FEV1/FVC ratio: ***% Interpretation: {Blank single:19197::"Spirometry consistent with mild obstructive disease","Spirometry consistent with moderate obstructive disease","Spirometry consistent with severe obstructive disease","Spirometry consistent with possible restrictive disease","Spirometry consistent with mixed obstructive and restrictive disease","Spirometry uninterpretable due to technique","Spirometry consistent with normal pattern","No overt abnormalities noted given today's efforts"}.  Please see scanned spirometry results for details.  Skin Testing: {Blank single:19197::"Select foods","Environmental allergy panel","Environmental allergy panel and select foods","Food allergy panel","None","Deferred due to recent antihistamines use"}. *** Results discussed with Kiara Bates/family.   Past Medical History: There are no problems to display for this Kiara Bates.  Past Medical History:  Diagnosis Date  . Medical history non-contributory   . No pertinent past medical history    Past Surgical History: Past Surgical History:  Procedure Laterality Date  . CESAREAN SECTION     Medication List:  Current Outpatient Medications  Medication Sig Dispense Refill  . acetaminophen (TYLENOL) 325 MG tablet Take 650 mg by mouth every 6 (six) hours as needed for mild pain or headache.    Marland Kitchen artificial tears (LACRILUBE) OINT ophthalmic ointment Place into the right eye 3 (three) times daily. 3.5 g 0  . ibuprofen (ADVIL,MOTRIN) 600 MG tablet Take 1 tablet (600 mg total) by mouth every 6 (six) hours as needed. 30 tablet 0  . Multiple Vitamin (MULTIVITAMIN WITH  MINERALS) TABS tablet Take 1 tablet by mouth daily.     No current facility-administered medications for this visit.   Allergies: Allergies  Allergen Reactions  . Penicillins Rash   Social History: Social History   Socioeconomic History  . Marital status: Single    Spouse name: Not on file  . Number of  children: Not on file  . Years of education: Not on file  . Highest education level: Not on file  Occupational History  . Not on file  Tobacco Use  . Smoking status: Never  . Smokeless tobacco: Not on file  Substance and Sexual Activity  . Alcohol use: No  . Drug use: No  . Sexual activity: Yes  Other Topics Concern  . Not on file  Social History Narrative  . Not on file   Social Determinants of Health   Financial Resource Strain: Not on file  Food Insecurity: Not on file  Transportation Needs: Not on file  Physical Activity: Not on file  Stress: Not on file  Social Connections: Not on file   Lives in a ***. Smoking: *** Occupation: ***  Environmental HistorySurveyor, minerals in the house: Copywriter, advertising in the family room: {Blank single:19197::"yes","no"} Carpet in the bedroom: {Blank single:19197::"yes","no"} Heating: {Blank single:19197::"electric","gas","heat pump"} Cooling: {Blank single:19197::"central","window","heat pump"} Pet: {Blank single:19197::"yes ***","no"}  Family History: No family history on file. Problem                               Relation Asthma                                   *** Eczema                                *** Food allergy                          *** Allergic rhino conjunctivitis     ***  Review of Systems  Constitutional:  Negative for appetite change, chills, fever and unexpected weight change.  HENT:  Negative for congestion and rhinorrhea.   Eyes:  Negative for itching.  Respiratory:  Negative for cough, chest tightness, shortness of breath and wheezing.   Cardiovascular:  Negative  for chest pain.  Gastrointestinal:  Negative for abdominal pain.  Genitourinary:  Negative for difficulty urinating.  Skin:  Negative for rash.  Neurological:  Negative for headaches.   Objective: There were no vitals taken for this visit. There is no height or weight on file to calculate BMI. Physical Exam Vitals and nursing note reviewed.  Constitutional:      Appearance: Normal appearance. She is well-developed.  HENT:     Head: Normocephalic and atraumatic.     Right Ear: Tympanic membrane and external ear normal.     Left Ear: Tympanic membrane and external ear normal.     Nose: Nose normal.     Mouth/Throat:     Mouth: Mucous membranes are moist.     Pharynx: Oropharynx is clear.  Eyes:     Conjunctiva/sclera: Conjunctivae normal.  Cardiovascular:     Rate and Rhythm: Normal rate and regular rhythm.     Heart sounds: Normal heart sounds. No murmur heard.    No friction rub. No gallop.  Pulmonary:     Effort: Pulmonary effort is normal.     Breath sounds: Normal breath sounds. No wheezing, rhonchi or rales.  Musculoskeletal:     Cervical back: Neck supple.  Skin:    General: Skin is warm.     Findings: No rash.  Neurological:     Mental  Status: She is alert and oriented to person, place, and time.  Psychiatric:        Behavior: Behavior normal.  The plan was reviewed with the Kiara Bates/family, and all questions/concerned were addressed.  It was my pleasure to see Tora today and participate in her care. Please feel free to contact me with any questions or concerns.  Sincerely,  Rexene Alberts, DO Allergy & Immunology  [facility] office: [place] office: [phone]

## 2022-09-15 ENCOUNTER — Ambulatory Visit: Payer: Medicaid Other | Admitting: Allergy

## 2022-12-26 IMAGING — US US PELVIS COMPLETE WITH TRANSVAGINAL
1 series · 13 of 25 positions shown · non-contrast
Comparison: None

CLINICAL DATA: Pelvic pain in a female, severe pain, on Depo shots

EXAM:
TRANSABDOMINAL AND TRANSVAGINAL ULTRASOUND OF PELVIS
TECHNIQUE: Both transabdominal and transvaginal ultrasound examinations of the
pelvis were performed. Transabdominal technique was performed for
global imaging of the pelvis including uterus, ovaries, adnexal
regions, and pelvic cul-de-sac. It was necessary to proceed with
endovaginal exam following the transabdominal exam to visualize the
endometrium and ovaries.

[Series 1: us pelvis complete with transvaginal · 0.24mm/px · 13 of 107 slices shown]
[im 1/107]
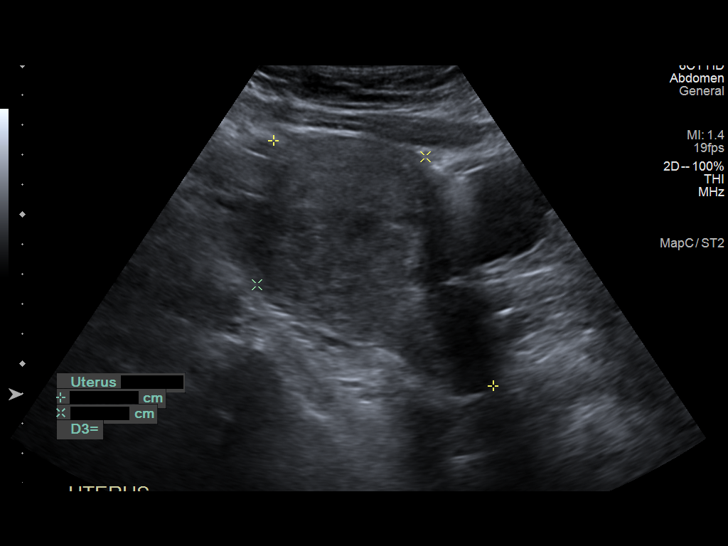
[im 9/107]
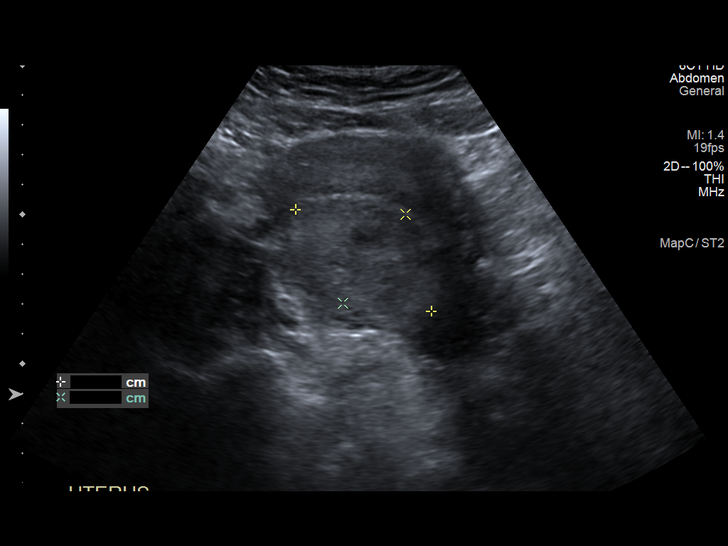
[im 18/107]
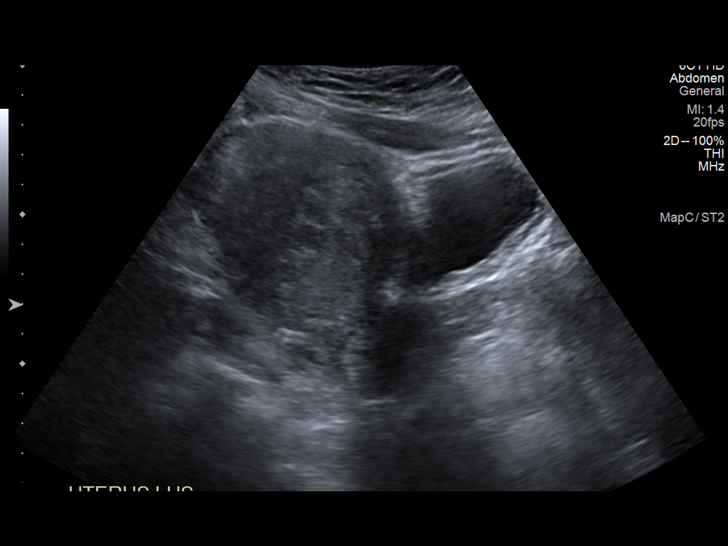
[im 27/107]
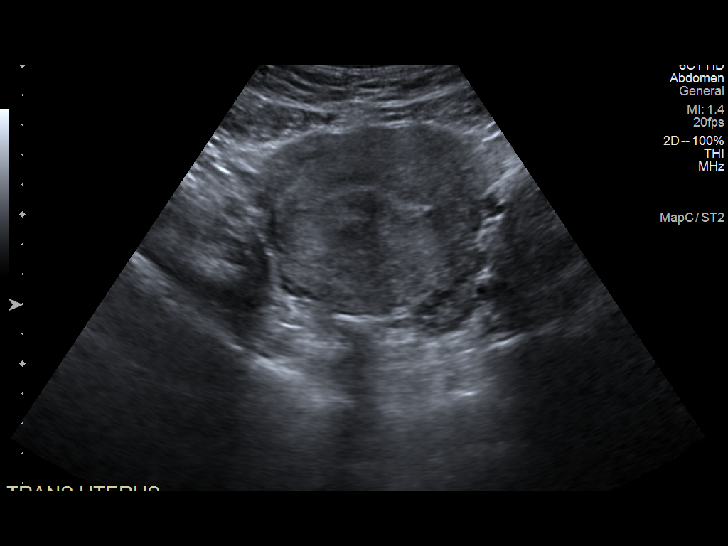
[im 36/107]
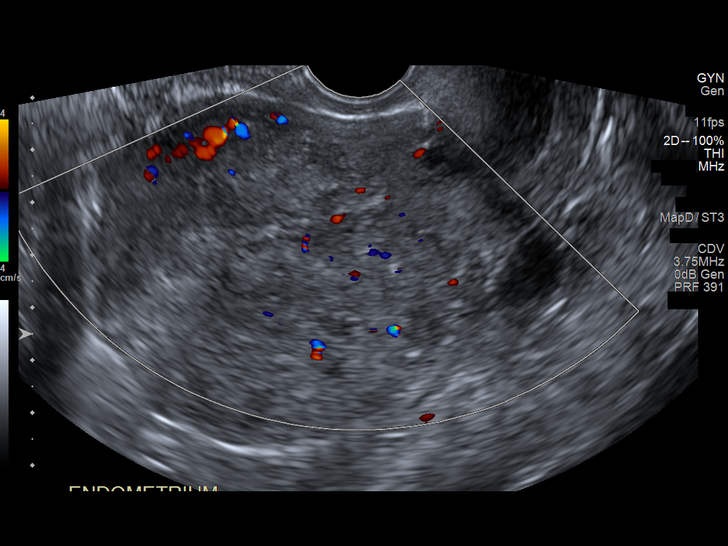
[im 45/107]
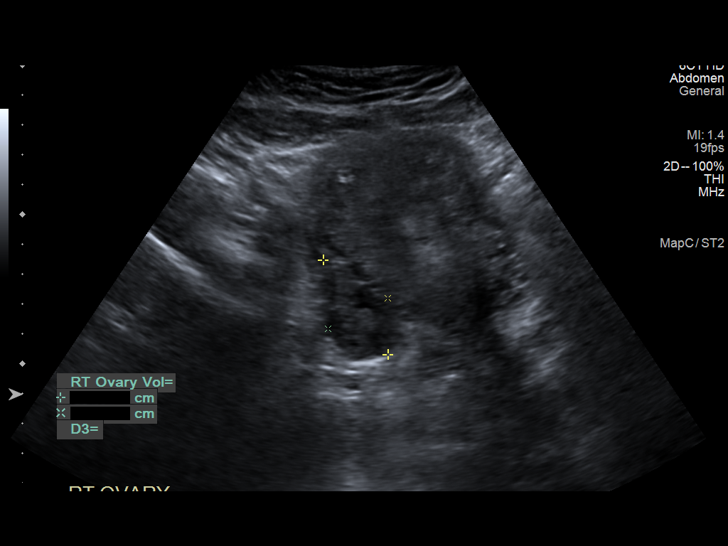
[im 54/107]
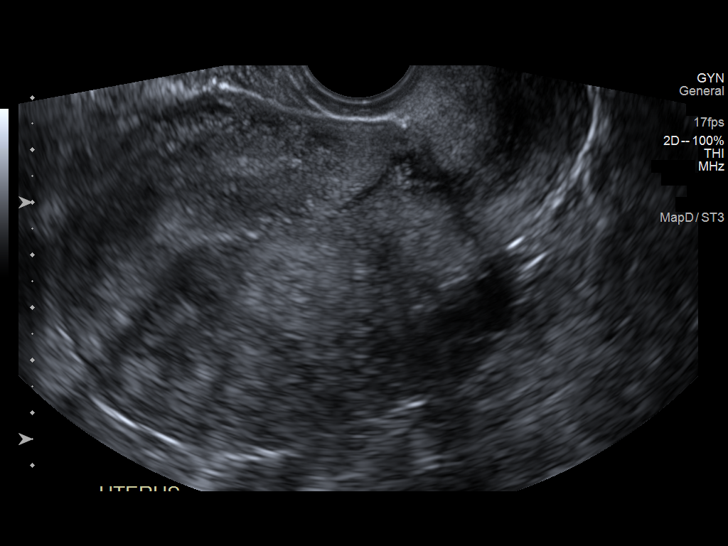
[im 62/107]
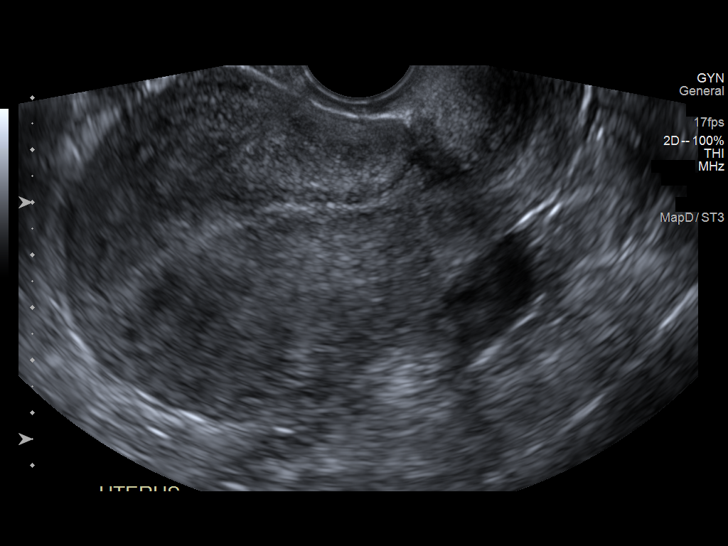
[im 71/107]
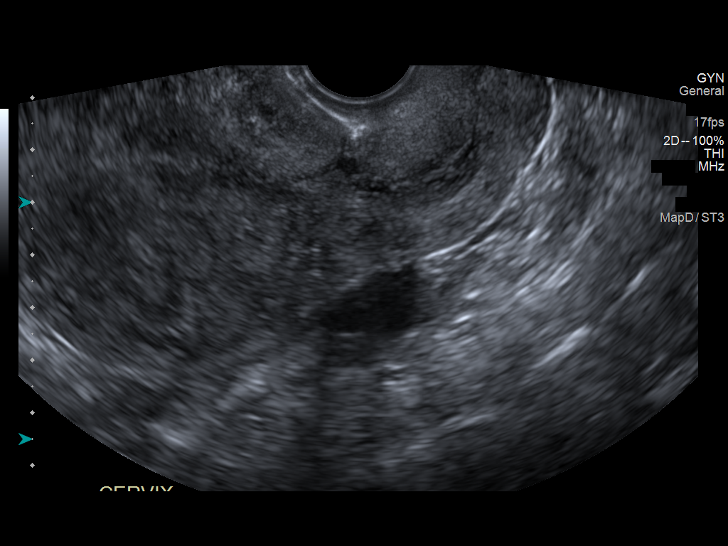
[im 80/107]
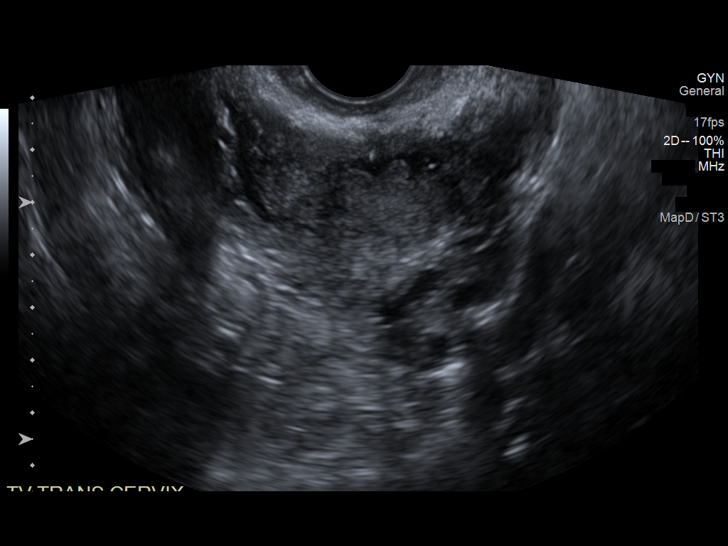
[im 89/107]
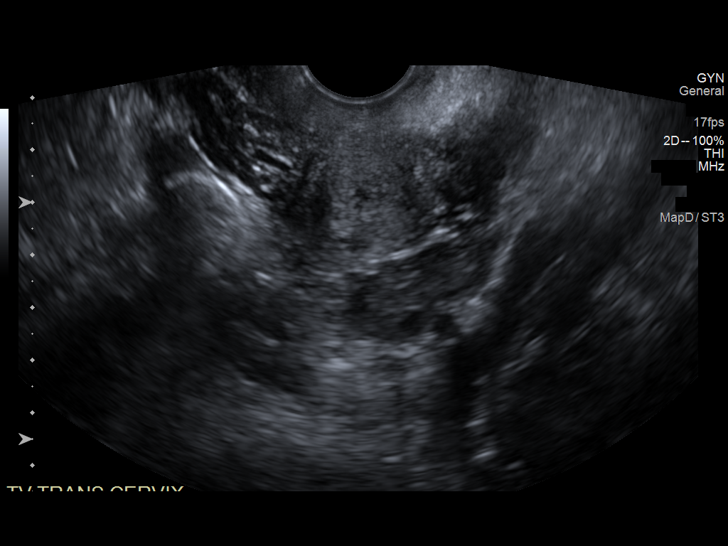
[im 98/107]
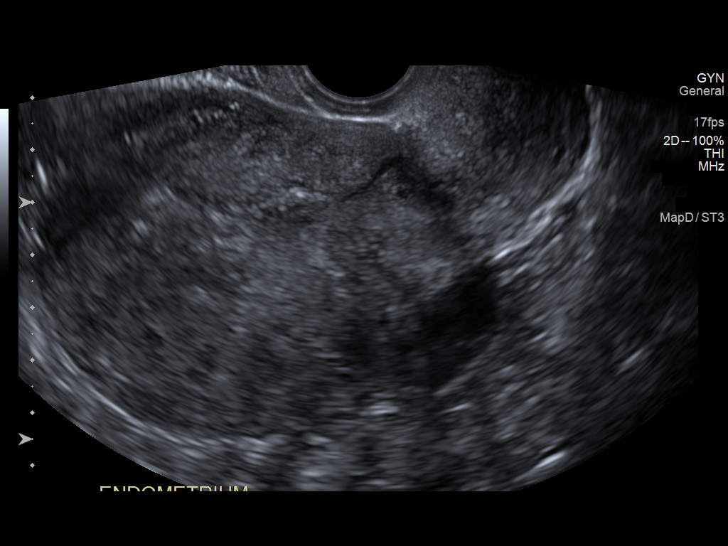
[im 107/107]
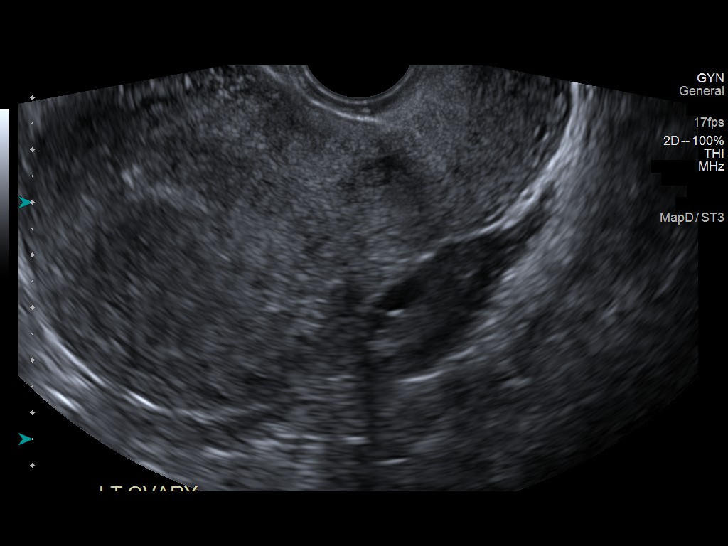

[13 of 25 positions shown; findings below may reference images not displayed]

FINDINGS: Uterus

Measurements: 11.0 x 7.1 x 8.2 cm = volume: 333 mL. Anteverted.
Heterogeneous myometrium. At least 2 uterine nodules consistent with
leiomyomata. These include a 5.7 cm diameter leiomyoma at posterior
upper uterus, question transmural and a 4.2 cm leiomyoma at fundus
subserosal. Anterior wall Caesarean section scar.

Endometrium

Thickness: 12 mm.  No endometrial mass or fluid

Right ovary

Measurements: 4.0 x 2.3 x 3.5 cm = volume: 16.7 mL. Normal
morphology without mass

Left ovary

Measurements: 4.4 x 2.1 x 3.3 cm = volume: 15.6 mL. Normal
morphology without mass

Other findings

No free pelvic fluid.  No adnexal masses.
IMPRESSION: At least 2 uterine leiomyomata as above, largest 5.7 cm diameter at
posterior upper uterus, potentially transmural.

Unremarkable endometrial complex and ovaries.

## 2023-02-11 ENCOUNTER — Ambulatory Visit (HOSPITAL_COMMUNITY)
Admission: EM | Admit: 2023-02-11 | Discharge: 2023-02-11 | Disposition: A | Discharge status: Home / Self Care | Payer: Medicaid Other | Attending: Emergency Medicine | Admitting: Emergency Medicine

## 2023-02-11 DIAGNOSIS — H11221 Conjunctival granuloma, right eye: Secondary | ICD-10-CM

## 2023-02-11 DIAGNOSIS — H578A1 Foreign body sensation, right eye: Secondary | ICD-10-CM

## 2023-02-11 MED ORDER — TETRACAINE HCL 0.5 % OP SOLN
2.0000 [drp] | Freq: Once | OPHTHALMIC | Status: AC
  Administered 2023-02-11: 2 [drp] via OPHTHALMIC

## 2023-02-11 MED ORDER — FLUORESCEIN SODIUM 1 MG OP STRP
ORAL_STRIP | OPHTHALMIC | Status: AC
  Filled 2023-02-11: qty 1

## 2023-02-11 MED ORDER — TETRACAINE HCL 0.5 % OP SOLN
OPHTHALMIC | Status: AC
  Filled 2023-02-11: qty 4

## 2023-02-11 MED ORDER — ERYTHROMYCIN 5 MG/GM OP OINT: TOPICAL_OINTMENT | OPHTHALMIC | 0 refills | Status: AC

## 2023-02-11 NOTE — ED Provider Notes (Signed)
[facility]    CSN: [id number] Arrival date & time: 02/11/23  1547      History   Chief Complaint Chief Complaint  Patient presents with   Eye Pain    HPI Kiara Bates is a 37 y.o. female.   37 year old female , Kiara Bates , presents to urgent care with chief complaint of "something in right inner lower eye, its an eyelash".  Patient reports she has tried to sterilize a needle and get it out as well as Q-tip.  She requesting this provider "just pluck it out" pt was seen in local ER 2016 for similar issues and was referred to opthalmology.  Patient denies any blurry or visual changes," feels like something is in there"  The history is provided by the patient. No language interpreter was used.    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 Authorizing Provider  erythromycin ophthalmic ointment Place a 1/2 inch ribbon of ointment into the right lower eyelid every 6 hours x 3 days 02/11/23  Yes Keeon Zurn, Para March, NP  acetaminophen (TYLENOL) 325 MG tablet Take 650 mg by mouth every 6 (six) hours as needed for mild pain or headache.    [provider]  artificial tears (LACRILUBE) OINT ophthalmic ointment Place into the right eye 3 (three) times daily. 06/02/15   Santiago Glad, PA-C  ibuprofen (ADVIL,MOTRIN) 600 MG tablet Take 1 tablet (600 mg total) by mouth every 6 (six) hours  as needed. 12/05/14   Earley Favor, NP  Multiple Vitamin (MULTIVITAMIN WITH MINERALS) TABS tablet Take 1 tablet by mouth daily.    [provider]    Family History No family history on file.  Social History Social History   Tobacco Use   Smoking status: Never  Substance Use Topics   Alcohol use: No   Drug use: No     Allergies   Penicillins   Review of Systems Review of Systems  Eyes:  Negative for photophobia.  All other systems reviewed and are negative.    Physical Exam Triage Vital Signs ED Triage Vitals  Enc Vitals Group     BP 02/11/23 1633 (!) 137/91     Pulse Rate 02/11/23 1633 96     Resp 02/11/23 1633 18     Temp 02/11/23 1633 99.6 F (37.6 C)     Temp Source 02/11/23 1633 Oral     SpO2 02/11/23 1633 96 %     Weight --      Height --      Head Circumference --  Peak Flow --      Pain Score 02/11/23 1636 0     Pain Loc --      Pain Edu? --      Excl. in GC? --    No data found.  Updated Vital Signs BP (!) 137/91 (BP Location: Left Arm)   Pulse 96   Temp 99.6 F (37.6 C) (Oral)   Resp 18   SpO2 96%   Visual Acuity Right Eye Distance:   Left Eye Distance:   Bilateral Distance:    Right Eye Near:   Left Eye Near:    Bilateral Near:     Physical Exam Vitals and nursing note reviewed.  HENT:     Head: Normocephalic.  Eyes:     General: Lids are normal.     Extraocular Movements: Extraocular movements intact.     Pupils: Pupils are equal, round, and reactive to light.     Right eye: No corneal abrasion or fluorescein uptake.     Left eye: No corneal abrasion or fluorescein uptake.     Comments: Granulomas noted to right inner lower conjunctiva  Neurological:     General: No focal deficit present.     Mental Status: She is alert and oriented to person, place, and time.     GCS: GCS eye subscore is 4. GCS verbal subscore is 5. GCS motor subscore is 6.  Psychiatric:        Attention and Perception: Attention normal.         Mood and Affect: Mood normal.      UC Treatments / Results  Labs (all labs ordered are listed, but only abnormal results are displayed) Labs Reviewed - No data to display  EKG   Radiology No results found.  Procedures Procedures (including critical care time)  Medications Ordered in UC Medications  tetracaine (PONTOCAINE) 0.5 % ophthalmic solution 2 drop (2 drops Right Eye Given 02/11/23 1648)    Initial Impression / Assessment and Plan / UC Course  I have reviewed the triage vital signs and the nursing notes.  Pertinent labs & imaging results that were available during my care of the patient were reviewed by me and considered in my medical decision making (see chart for details).    Pt requesting antibiotic, Empirically treating w erythromycin ointment due to recent possible eye irritation/trauma, referred pt back to her ophthalmologist-call tomorrow for appt.    Ddx: Foreign body sensation, Pyogenic granuloma Final Clinical Impressions(s) / UC Diagnoses   Final diagnoses:  Pyogenic granuloma of conjunctiva, right  Foreign body sensation, right eye     Discharge Instructions      Please follow up with ophthalmologist of your choice. Avoid sticking anything in your eye. Wash hands before and after eye ointment application.    ED Prescriptions     Medication Sig Dispense Auth. Provider   erythromycin ophthalmic ointment Place a 1/2 inch ribbon of ointment into the right lower eyelid every 6 hours x 3 days 3.5 g Carle Fenech, Para March, NP      PDMP not reviewed this encounter.   Clancy Gourd, NP 02/11/23 [phone]

## 2023-02-11 NOTE — ED Triage Notes (Signed)
Pt reports something is in her right eye. Pt reports using eye wash with no relief.

## 2023-02-11 NOTE — Discharge Instructions (Signed)
Please follow up with ophthalmologist of your choice. Avoid sticking anything in your eye. Wash hands before and after eye ointment application.

## 2023-02-12 ENCOUNTER — Encounter (INDEPENDENT_AMBULATORY_CARE_PROVIDER_SITE_OTHER): Payer: Medicaid Other | Admitting: Ophthalmology

## 2023-02-12 NOTE — Progress Notes (Addendum)
[facility] - Clinic Note  02/13/2023   CHIEF COMPLAINT Patient presents for Eye Problem  HISTORY OF PRESENT ILLNESS: Kiara Bates is a 37 y.o. female who presents to the clinic today for:  HPI   Patient referred by Urgent care. Patient states vision is fine. Has an eyelash embedded in OD. Can see it but can't get it out herself. This has happened before. Has granuloma in Right lower lid wants looked at. No eye pain. Was prescribed eye drops. Pharmacy closed couldn't pick up.  Last edited by Rennis Chris, MD on 02/13/2023  9:13 PM.    Pt is here on Urgent Care referral for an eyelash stuck in her right eye, pt states she has pyogenic granuloma OD, she states she was told this several years ago last time she had a lash in her eye, she states she has tried to get the lash out with a sterilized q tip and eye wash, but was unable to, pt has no eye hx, does not take any daily medications   Referring physician: No referring provider defined for this encounter.  HISTORICAL INFORMATION:  Selected notes from the MEDICAL RECORD NUMBER Urgent Care f/u for eyelash stuck in eye LEE:  Ocular Hx- PMH-   CURRENT MEDICATIONS: Current Outpatient Medications (Ophthalmic Drugs)  Medication Sig   artificial tears (LACRILUBE) OINT ophthalmic ointment Place into the right eye 3 (three) times daily. (Patient not taking: Reported on 02/13/2023)   erythromycin ophthalmic ointment Place a 1/2 inch ribbon of ointment into the right lower eyelid every 6 hours x 3 days (Patient not taking: Reported on 02/13/2023)   No current facility-administered medications for this visit. (Ophthalmic Drugs)   Current Outpatient Medications (Other)  Medication Sig   acetaminophen (TYLENOL) 325 MG tablet Take 650 mg by mouth every 6 (six) hours as needed for mild pain or headache. (Patient not taking: Reported on 02/13/2023)   ibuprofen (ADVIL,MOTRIN) 600 MG tablet Take 1 tablet (600 mg total) by mouth  every 6 (six) hours as needed. (Patient not taking: Reported on 02/13/2023)   Multiple Vitamin (MULTIVITAMIN WITH MINERALS) TABS tablet Take 1 tablet by mouth daily. (Patient not taking: Reported on 02/13/2023)   No current facility-administered medications for this visit. (Other)   REVIEW OF SYSTEMS: ROS   Positive for: Eyes Last edited by Laddie Aquas, COA on 02/13/2023  8:30 AM.     ALLERGIES Allergies  Allergen Reactions   Penicillins Rash   PAST MEDICAL HISTORY Past Medical History:  Diagnosis Date   Medical history non-contributory    No pertinent past medical history    Past Surgical History:  Procedure Laterality Date   CESAREAN SECTION     FAMILY HISTORY History reviewed. No pertinent family history. SOCIAL HISTORY Social History   Tobacco Use   Smoking status: Never  Vaping Use   Vaping Use: Unknown  Substance Use Topics   Alcohol use: No   Drug use: No       OPHTHALMIC EXAM:  Base Eye Exam     Visual Acuity (Snellen - Linear)       Right Left   Dist [place] 20/20 20/20         Tonometry (Tonopen, 8:26 AM)       Right Left   Pressure 22 22         Pupils       Dark Light Shape React APD   Right 3 2 Round Brisk None  Left 3 2 Round Brisk None         Visual Fields (Counting fingers)       Left Right    Full Full         Extraocular Movement       Right Left    Full, Ortho Full, Ortho         Neuro/Psych     Oriented x3: Yes   Mood/Affect: Normal         Dilation     Both eyes: 1.0% Mydriacyl, 2.5% Phenylephrine @ 8:26 AM           Slit Lamp and Fundus Exam     Slit Lamp Exam       Right Left   Lids/Lashes Normal Normal   Conjunctiva/Sclera mild melanosis, pyogenic granulomas medial lower lid palpebral conj with ?hair follicles; fleshy mass with fine hairs -- just inf temp to caruncle / inf nasal fornix mild melanosis, fleshy mass with fine hairs -- just inf temp to caruncle / inf nasal fornix   Cornea  Clear Clear   Anterior Chamber deep and clear deep and clear   Iris Round and dilated Round and dilated   Lens Clear Clear   Anterior Vitreous clear clear         Fundus Exam       Right Left   Disc Pink and Sharp Pink and Sharp   C/D Ratio 0.6 0.6   Macula Flat, Good foveal reflex, No heme or edema Flat, Good foveal reflex, No heme or edema   Vessels Normal Normal   Periphery Attached Attached           IMAGING AND PROCEDURES  Imaging and Procedures for 02/13/2023  OCT, Retina - OU - Both Eyes       Right Eye Quality was good. Central Foveal Thickness: 291. Progression has no prior data. Findings include normal foveal contour, no IRF, no SRF, vitreomacular adhesion .   Left Eye Quality was good. Central Foveal Thickness: 276. Progression has no prior data. Findings include normal foveal contour, no IRF, no SRF.   Notes *Images captured and stored on drive  Diagnosis / Impression:  NFP, no IRF/SRF OU  Clinical management:  See below  Abbreviations: NFP - Normal foveal profile. CME - cystoid macular edema. PED - pigment epithelial detachment. IRF - intraretinal fluid. SRF - subretinal fluid. EZ - ellipsoid zone. ERM - epiretinal membrane. ORA - outer retinal atrophy. ORT - outer retinal tubulation. SRHM - subretinal hyper-reflective material. 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 Pyogenic granuloma of conjunctiva, right  H11.221     4. 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 Conjunctival lesion w/ abnormal hair/eyelash growth OU  - fleshy mass with fine hairs -- just inf temp to caruncle / inf nasal fornix  - larger lash either embedded or growing out of lesion OD causing foreign body sensation -- removed at  slit lamp with topical proparacaine -- no complications  - ?dermoid-like lesion that is growing fine hairs / lashes OU  - will refer to Dr. Pearletha Furl at [facility] or Dr. Noel Gerold  at [facility] for further evaluation and management  3. Pyogenic granuloma OD  - located on medial aspect of palpebral conj of right lower lid  - ?hair follicles within  4. No retinal edema on exam or OCT   Ophthalmic Meds Ordered this visit:  No orders of the defined types were placed in this encounter.    Return if symptoms worsen or fail to improve.  There are no Patient Instructions on file for this visit.  Explained the diagnoses, plan, and follow up with the patient and they expressed understanding.  Patient expressed understanding of the importance of proper follow up care.   This document serves as a record of services personally performed by Karie Chimera, MD, PhD. It was created on their behalf by Glee Arvin. Manson Passey, OA an ophthalmic technician. The creation of this record is the provider's dictation and/or activities during the visit.    Electronically signed by: Glee Arvin. Manson Passey, [place] 04.15.2024 9:25 PM  Karie Chimera, M.D., Ph.D. Diseases & Surgery of the Retina and Vitreous [facility] 02/13/2023  I have reviewed the above documentation for accuracy and completeness, and I agree with the above. Karie Chimera, M.D., Ph.D. 02/13/23 9:31 PM   Abbreviations: M myopia (nearsighted); A astigmatism; H hyperopia (farsighted); P presbyopia; Mrx spectacle prescription;  CTL contact lenses; OD right eye; OS left eye; OU both eyes  XT exotropia; ET esotropia; PEK punctate epithelial keratitis; PEE punctate epithelial erosions; DES dry eye syndrome; MGD meibomian gland dysfunction; ATs artificial tears; PFAT's preservative free artificial tears; NSC nuclear sclerotic cataract; PSC posterior subcapsular cataract; ERM epi-retinal membrane; PVD posterior vitreous detachment; RD retinal  detachment; DM diabetes mellitus; DR diabetic retinopathy; NPDR non-proliferative diabetic retinopathy; PDR proliferative diabetic retinopathy; CSME clinically significant macular edema; DME diabetic macular edema; dbh dot blot hemorrhages; CWS cotton wool spot; POAG primary open angle glaucoma; C/D cup-to-disc ratio; HVF humphrey visual field; GVF goldmann visual field; OCT optical coherence tomography; IOP intraocular pressure; BRVO Branch retinal vein occlusion; CRVO central retinal vein occlusion; CRAO central retinal artery occlusion; BRAO branch retinal artery occlusion; RT retinal tear; SB scleral buckle; PPV pars plana vitrectomy; VH Vitreous hemorrhage; PRP panretinal laser photocoagulation; IVK intravitreal kenalog; VMT vitreomacular traction; MH Macular hole;  NVD neovascularization of the disc; NVE neovascularization elsewhere; AREDS age related eye disease study; ARMD age related macular degeneration; POAG primary open angle glaucoma; EBMD epithelial/anterior basement membrane dystrophy; ACIOL anterior chamber intraocular lens; IOL intraocular lens; PCIOL posterior chamber intraocular lens; Phaco/IOL phacoemulsification with intraocular lens placement; PRK photorefractive keratectomy; LASIK laser assisted in situ keratomileusis; HTN hypertension; DM diabetes mellitus; COPD chronic obstructive pulmonary disease

## 2023-02-13 ENCOUNTER — Encounter (INDEPENDENT_AMBULATORY_CARE_PROVIDER_SITE_OTHER): Payer: Self-pay | Admitting: Ophthalmology

## 2023-02-13 ENCOUNTER — Ambulatory Visit (INDEPENDENT_AMBULATORY_CARE_PROVIDER_SITE_OTHER): Payer: Medicaid Other | Admitting: Ophthalmology

## 2023-02-13 DIAGNOSIS — H11221 Conjunctival granuloma, right eye: Secondary | ICD-10-CM | POA: Diagnosis not present

## 2023-02-13 DIAGNOSIS — H0289 Other specified disorders of eyelid: Secondary | ICD-10-CM | POA: Diagnosis not present

## 2023-02-13 DIAGNOSIS — H3581 Retinal edema: Secondary | ICD-10-CM

## 2023-02-13 DIAGNOSIS — H02052 Trichiasis without entropian right lower eyelid: Secondary | ICD-10-CM | POA: Diagnosis not present

## 2023-02-13 DIAGNOSIS — D4989 Neoplasm of unspecified behavior of other specified sites: Secondary | ICD-10-CM | POA: Diagnosis not present

## 2024-03-27 ENCOUNTER — Emergency Department (HOSPITAL_COMMUNITY)
Admission: EM | Admit: 2024-03-27 | Discharge: 2024-03-27 | Disposition: A | Discharge status: Home / Self Care | Attending: Emergency Medicine | Admitting: Emergency Medicine

## 2024-03-27 ENCOUNTER — Encounter (HOSPITAL_COMMUNITY): Payer: Self-pay

## 2024-03-27 ENCOUNTER — Emergency Department (HOSPITAL_COMMUNITY)

## 2024-03-27 ENCOUNTER — Other Ambulatory Visit: Payer: Self-pay

## 2024-03-27 DIAGNOSIS — D219 Benign neoplasm of connective and other soft tissue, unspecified: Secondary | ICD-10-CM

## 2024-03-27 DIAGNOSIS — R110 Nausea: Secondary | ICD-10-CM | POA: Diagnosis not present

## 2024-03-27 DIAGNOSIS — R102 Pelvic and perineal pain: Secondary | ICD-10-CM | POA: Diagnosis present

## 2024-03-27 DIAGNOSIS — N3000 Acute cystitis without hematuria: Secondary | ICD-10-CM

## 2024-03-27 DIAGNOSIS — D259 Leiomyoma of uterus, unspecified: Secondary | ICD-10-CM | POA: Insufficient documentation

## 2024-03-27 LAB — CBC WITH DIFFERENTIAL/PLATELET
Abs Immature Granulocytes: 0.02 10*3/uL (ref 0.00–0.07)
Basophils Absolute: 0.1 10*3/uL (ref 0.0–0.1)
Basophils Relative: 1 %
Eosinophils Absolute: 0.1 10*3/uL (ref 0.0–0.5)
Eosinophils Relative: 2 %
HCT: 40.5 % (ref 36.0–46.0)
Hemoglobin: 13.4 g/dL (ref 12.0–15.0)
Immature Granulocytes: 0 %
Lymphocytes Relative: 36 %
Lymphs Abs: 2.3 10*3/uL (ref 0.7–4.0)
MCH: 32.1 pg (ref 26.0–34.0)
MCHC: 33.1 g/dL (ref 30.0–36.0)
MCV: 96.9 fL (ref 80.0–100.0)
Monocytes Absolute: 0.6 10*3/uL (ref 0.1–1.0)
Monocytes Relative: 9 %
Neutro Abs: 3.2 10*3/uL (ref 1.7–7.7)
Neutrophils Relative %: 52 %
Platelets: 308 10*3/uL (ref 150–400)
RBC: 4.18 MIL/uL (ref 3.87–5.11)
RDW: 12.6 % (ref 11.5–15.5)
WBC: 6.3 10*3/uL (ref 4.0–10.5)
nRBC: 0 % (ref 0.0–0.2)

## 2024-03-27 LAB — HCG, SERUM, QUALITATIVE: Preg, Serum: NEGATIVE

## 2024-03-27 LAB — COMPREHENSIVE METABOLIC PANEL WITH GFR
ALT: 10 U/L (ref 0–44)
AST: 17 U/L (ref 15–41)
Albumin: 4.2 g/dL (ref 3.5–5.0)
Alkaline Phosphatase: 62 U/L (ref 38–126)
Anion gap: 7 (ref 5–15)
BUN: 11 mg/dL (ref 6–20)
CO2: 25 mmol/L (ref 22–32)
Calcium: 9.6 mg/dL (ref 8.9–10.3)
Chloride: 107 mmol/L (ref 98–111)
Creatinine, Ser: 0.73 mg/dL (ref 0.44–1.00)
GFR, Estimated: >60 mL/min (ref >60)
Glucose, Bld: 101 mg/dL — ABNORMAL HIGH (ref 70–99)
Potassium: 3.5 mmol/L (ref 3.5–5.1)
Sodium: 139 mmol/L (ref 135–145)
Total Bilirubin: 1 mg/dL (ref 0.0–1.2)
Total Protein: 7.7 g/dL (ref 6.5–8.1)

## 2024-03-27 LAB — URINALYSIS, ROUTINE W REFLEX MICROSCOPIC
Bilirubin Urine: NEGATIVE
Glucose, UA: NEGATIVE mg/dL
Hgb urine dipstick: NEGATIVE
Ketones, ur: NEGATIVE mg/dL
Nitrite: NEGATIVE
Protein, ur: NEGATIVE mg/dL
Specific Gravity, Urine: 1.025 (ref 1.005–1.030)
pH: 6 (ref 5.0–8.0)

## 2024-03-27 LAB — LIPASE, BLOOD: Lipase: 36 U/L (ref 11–51)

## 2024-03-27 MED ORDER — CEPHALEXIN 500 MG PO CAPS
500.0000 mg | ORAL_CAPSULE | Freq: Two times a day (BID) | ORAL | 0 refills | Status: DC
Start: 2024-03-27 — End: 2024-03-27

## 2024-03-27 MED ORDER — OXYCODONE-ACETAMINOPHEN 5-325 MG PO TABS
1.0000 | ORAL_TABLET | Freq: Once | ORAL | Status: DC
  Filled 2024-03-27: qty 1

## 2024-03-27 MED ORDER — CEPHALEXIN 500 MG PO CAPS
500.0000 mg | ORAL_CAPSULE | Freq: Once | ORAL | Status: AC
  Administered 2024-03-27: 500 mg via ORAL
  Filled 2024-03-27: qty 1

## 2024-03-27 MED ORDER — CEPHALEXIN 500 MG PO CAPS: 500.0000 mg | ORAL_CAPSULE | Freq: Two times a day (BID) | ORAL | 0 refills | Status: AC

## 2024-03-27 MED ORDER — ONDANSETRON 4 MG PO TBDP
4.0000 mg | ORAL_TABLET | Freq: Once | ORAL | Status: AC
  Administered 2024-03-27: 4 mg via ORAL
  Filled 2024-03-27: qty 1

## 2024-03-27 NOTE — ED Triage Notes (Signed)
 Patient presented to triage with vaginal/lower abdominal pressure radiating to lower back. Patient denies bleeding with the pressure. Patient also states she has been feeling nauseous and fatigued, has chills.

## 2024-03-27 NOTE — ED Provider Notes (Signed)
 [place] EMERGENCY DEPARTMENT AT [facility] Provider Note   CSN: [id number] Arrival date & time: 03/27/24  1416     History  Chief Complaint  Patient presents with   Abdominal Pain    Vaginal pressure    Kiara Bates is a 38 y.o. female pelvic pain and vaginal pressure over the past few days. Patient's endorse nausea without emesis but denies any vaginal bleeding discharge or concerns for STDs constipation or diarrhea. Patient states she has had nausea without emesis. Patient states she has history of fibroids but is unsure if this feels similar. Patient states she is having chills but denies any fevers.  Patient is unsure if she is having urinary symptoms.  Home Medications Prior to Admission medications   Medication Sig Start Date End Date Taking? Authorizing Provider  acetaminophen  (TYLENOL ) 325 MG tablet Take 650 mg by mouth every 6 (six) hours as needed for mild pain or headache. Patient not taking: Reported on 02/13/2023    [provider]  artificial tears (LACRILUBE) OINT ophthalmic ointment Place into the right eye 3 (three) times daily. Patient not taking: Reported on 02/13/2023 06/02/15   Letcher Rattler, PA-C  erythromycin  ophthalmic ointment Place a 1/2 inch ribbon of ointment into the right lower eyelid every 6 hours x 3 days Patient not taking: Reported on 02/13/2023 02/11/23   Defelice, Jeanette, NP  ibuprofen  (ADVIL ,MOTRIN ) 600 MG tablet Take 1 tablet (600 mg total) by mouth every 6 (six) hours as needed. Patient not taking: Reported on 02/13/2023 12/05/14   Franceen Inches, NP  Multiple Vitamin (MULTIVITAMIN WITH MINERALS) TABS tablet Take 1 tablet by mouth daily. Patient not taking: Reported on 02/13/2023    [provider]      Allergies    Penicillins    Review of Systems   Review of Systems  Gastrointestinal:  Positive for abdominal pain.    Physical Exam Updated Vital Signs BP (!) 151/101 (BP Location: Left Arm)   Pulse 86    Temp 98.6 F (37 C) (Oral)   Resp 14   Ht 5\' 5"  (1.651 m)   Wt 95 kg   SpO2 100%   BMI 34.85 kg/m  Physical Exam Vitals reviewed.  Constitutional:      General: She is not in acute distress. HENT:     Head: Normocephalic and atraumatic.  Eyes:     Extraocular Movements: Extraocular movements intact.     Conjunctiva/sclera: Conjunctivae normal.     Pupils: Pupils are equal, round, and reactive to light.  Cardiovascular:     Rate and Rhythm: Normal rate and regular rhythm.     Pulses: Normal pulses.     Heart sounds: Normal heart sounds.     Comments: 2+ bilateral radial/dorsalis pedis pulses with regular rate Pulmonary:     Effort: Pulmonary effort is normal. No respiratory distress.     Breath sounds: Normal breath sounds.  Abdominal:     Palpations: Abdomen is soft.     Tenderness: There is no abdominal tenderness. There is no guarding or rebound.  Musculoskeletal:        General: Normal range of motion.     Cervical back: Normal range of motion and neck supple.     Comments: 5 out of 5 bilateral grip/leg extension strength  Skin:    General: Skin is warm and dry.     Capillary Refill: Capillary refill takes less than 2 seconds.  Neurological:     General: No focal deficit  present.     Mental Status: She is alert and oriented to person, place, and time.     Comments: Sensation intact in all 4 limbs  Psychiatric:        Mood and Affect: Mood normal.     ED Results / Procedures / Treatments   Labs (all labs ordered are listed, but only abnormal results are displayed) Labs Reviewed  COMPREHENSIVE METABOLIC PANEL WITH GFR - Abnormal; Notable for the following components:      Result Value   Glucose, Bld 101 (*)    All other components within normal limits  URINALYSIS, ROUTINE W REFLEX MICROSCOPIC - Abnormal; Notable for the following components:   APPearance HAZY (*)    Leukocytes,Ua TRACE (*)    Bacteria, UA MANY (*)    All other components within normal limits   LIPASE, BLOOD  CBC WITH DIFFERENTIAL/PLATELET  HCG, SERUM, QUALITATIVE    EKG None  Radiology US  Pelvis Complete Result Date: 03/27/2024 CLINICAL DATA:  Pelvic pain. EXAM: TRANSABDOMINAL AND TRANSVAGINAL ULTRASOUND OF PELVIS DOPPLER ULTRASOUND OF OVARIES TECHNIQUE: Both transabdominal and transvaginal ultrasound examinations of the pelvis were performed. Transabdominal technique was performed for global imaging of the pelvis including uterus, ovaries, adnexal regions, and pelvic cul-de-sac. It was necessary to proceed with endovaginal exam following the transabdominal exam to visualize the uterus, endometrium, bilateral ovaries and bilateral adnexa. Color and duplex Doppler ultrasound was utilized to evaluate blood flow to the ovaries. COMPARISON:  None Available. FINDINGS: Uterus Measurements: 10.4 cm x 6.6 cm x 7.5 cm = volume: 268.1 mL. A 3.9 cm x 3.5 cm x 2.6 cm heterogeneous uterine fibroid is noted. Endometrium Thickness: 2.2 mm.  No focal abnormality visualized. Right ovary Measurements: 2.9 cm x 2.1 cm x 2.9 cm = volume: 12.3 mL. Normal appearance/no adnexal mass. Left ovary The left ovary is not visualized secondary to overlying bowel gas. Pulsed Doppler evaluation of both ovaries demonstrates normal low-resistance arterial and venous waveforms. Other findings No pelvic free fluid is seen. Limited study secondary to overlying bowel gas. IMPRESSION: 1. Limited study secondary to overlying bowel gas. 2. Heterogeneous uterine fibroid. Electronically Signed   By: Virgle Grime M.D.   On: 03/27/2024 18:15   US  Transvaginal Non-OB Result Date: 03/27/2024 CLINICAL DATA:  Pelvic pain. EXAM: TRANSABDOMINAL AND TRANSVAGINAL ULTRASOUND OF PELVIS DOPPLER ULTRASOUND OF OVARIES TECHNIQUE: Both transabdominal and transvaginal ultrasound examinations of the pelvis were performed. Transabdominal technique was performed for global imaging of the pelvis including uterus, ovaries, adnexal regions, and pelvic  cul-de-sac. It was necessary to proceed with endovaginal exam following the transabdominal exam to visualize the uterus, endometrium, bilateral ovaries and bilateral adnexa. Color and duplex Doppler ultrasound was utilized to evaluate blood flow to the ovaries. COMPARISON:  None Available. FINDINGS: Uterus Measurements: 10.4 cm x 6.6 cm x 7.5 cm = volume: 268.1 mL. A 3.9 cm x 3.5 cm x 2.6 cm heterogeneous uterine fibroid is noted. Endometrium Thickness: 2.2 mm.  No focal abnormality visualized. Right ovary Measurements: 2.9 cm x 2.1 cm x 2.9 cm = volume: 12.3 mL. Normal appearance/no adnexal mass. Left ovary The left ovary is not visualized secondary to overlying bowel gas. Pulsed Doppler evaluation of both ovaries demonstrates normal low-resistance arterial and venous waveforms. Other findings No pelvic free fluid is seen. Limited study secondary to overlying bowel gas. IMPRESSION: 1. Limited study secondary to overlying bowel gas. 2. Heterogeneous uterine fibroid. Electronically Signed   By: Virgle Grime M.D.   On: 03/27/2024 18:15  US  Art/Ven Flow Abd Pelv Doppler Result Date: 03/27/2024 CLINICAL DATA:  Pelvic pain. EXAM: TRANSABDOMINAL AND TRANSVAGINAL ULTRASOUND OF PELVIS DOPPLER ULTRASOUND OF OVARIES TECHNIQUE: Both transabdominal and transvaginal ultrasound examinations of the pelvis were performed. Transabdominal technique was performed for global imaging of the pelvis including uterus, ovaries, adnexal regions, and pelvic cul-de-sac. It was necessary to proceed with endovaginal exam following the transabdominal exam to visualize the uterus, endometrium, bilateral ovaries and bilateral adnexa. Color and duplex Doppler ultrasound was utilized to evaluate blood flow to the ovaries. COMPARISON:  None Available. FINDINGS: Uterus Measurements: 10.4 cm x 6.6 cm x 7.5 cm = volume: 268.1 mL. A 3.9 cm x 3.5 cm x 2.6 cm heterogeneous uterine fibroid is noted. Endometrium Thickness: 2.2 mm.  No focal  abnormality visualized. Right ovary Measurements: 2.9 cm x 2.1 cm x 2.9 cm = volume: 12.3 mL. Normal appearance/no adnexal mass. Left ovary The left ovary is not visualized secondary to overlying bowel gas. Pulsed Doppler evaluation of both ovaries demonstrates normal low-resistance arterial and venous waveforms. Other findings No pelvic free fluid is seen. Limited study secondary to overlying bowel gas. IMPRESSION: 1. Limited study secondary to overlying bowel gas. 2. Heterogeneous uterine fibroid. Electronically Signed   By: Virgle Grime M.D.   On: 03/27/2024 18:15    Procedures Procedures    Medications Ordered in ED Medications  oxyCODONE -acetaminophen  (PERCOCET/ROXICET) 5-325 MG per tablet 1 tablet (has no administration in time range)  ondansetron  (ZOFRAN -ODT) disintegrating tablet 4 mg (has no administration in time range)    ED Course/ Medical Decision Making/ A&P                                 Medical Decision Making Amount and/or Complexity of Data Reviewed Labs: ordered. Radiology: ordered.  Risk Prescription drug management.   Kiara Bates 38 y.o. presented today for pelvic discomfort. Working DDx that I considered at this time includes, but not limited to, UTI, fibroid, AUB, ectopic pregnancy, torsion, PID, STD.  R/o DDx: AUB, ectopic pregnancy, torsion, PID, STD: These are considered less likely due to history of present illness, physical exam, labs/imaging findings  Review of prior external notes: 03/19/2023 office visit  Unique Tests and My Independent Interpretation:  CBC: Unremarkable CMP: Unremarkable Lipase: Unremarkable hCG serum qualitative: Negative UA: Pyuria noted Urine culture: Pending Ultrasound pelvis: Fibroid present  Social Determinants of Health: none  Discussion with Independent Historian: None  Discussion of Management of Tests: None  Risk: Medium: prescription drug management  Risk Stratification Score: None  Plan: On exam  patient was no acute distress with stable vitals.  Patient's exam was ultimately unremarkable she has soft nontender abdomen without peritoneal signs.  Labs also reassuring but do show pyuria and after discussion with the patient we will treat with antibiotics and have urine culture sent.  Ultrasound does show fibroid and I did offer a pelvic exam however patient declined at this time and would like to follow-up with her OB/GYN at this time as she is not having any vaginal discharge or bleeding which is reasonable.  Will give first dose of antibiotics here and prescribe the rest and have her follow-up with her primary care doc as well.  Given ultrasound and lab findings and having soft nontender abdomen and no white count or fevers and not having any septic vitals do not believe patient needs CT imaging at this time.  Patient is not concerned about  STDs at this point so we will not test for this.  Patient was given return precautions. Patient stable for discharge at this time.  Patient verbalized understanding of plan.  This chart was dictated using voice recognition software.  Despite best efforts to proofread,  errors can occur which can change the documentation meaning.         Final Clinical Impression(s) / ED Diagnoses Final diagnoses:  None    Rx / DC Orders ED Discharge Orders     None         Elex Grimmer 03/27/24 1911    Afton Horse T, DO 03/29/24 1555

## 2024-03-27 NOTE — ED Provider Triage Note (Signed)
 Emergency Medicine Provider Triage Evaluation Note  Kiara Bates , a 38 y.o. female  was evaluated in triage.  Pt complains of pelvic pain and vaginal pressure over the past few days.  Patient's endorse nausea without emesis but denies any vaginal bleeding discharge or concerns for STDs constipation or diarrhea.  Patient states she has had nausea without emesis.  Patient states she has history of fibroids but is unsure if this feels similar.  Patient states she is having chills but denies any fevers.  Review of Systems  Positive:  Negative:   Physical Exam  BP (!) 143/103 (BP Location: Left Arm)   Pulse 78   Temp 99.3 F (37.4 C) (Oral)   Resp 16   Ht 5\' 5"  (1.651 m)   Wt 95 kg   SpO2 100%   BMI 34.85 kg/m  Gen:   Awake, no distress   Resp:  Normal effort  MSK:   Moves extremities without difficulty  Other:  Abdomen soft nontender without peritoneal signs  Medical Decision Making  Medically screening exam initiated at 3:08 PM.  Appropriate orders placed.  Kiara Bates was informed that the remainder of the evaluation will be completed by another provider, this initial triage assessment does not replace that evaluation, and the importance of remaining in the ED until their evaluation is complete.  Workup initiated, patient stable.   Denese Finn, PA-C 03/27/24 [phone]

## 2024-03-27 NOTE — Discharge Instructions (Signed)
 Please follow-up with your primary care provider and OB/GYN in regards to recent symptoms and ER visit.  Today you had some bacteria in your urine and so we will treat you for UTI.  You are given your first dose antibiotics here and will prescribe the rest.  You also appear to have a fibroid on the ultrasound as well which may be contributing to your symptoms.  If symptoms change or worsen please return to the ER.

## 2024-03-29 LAB — URINE CULTURE: Culture: 80000 — AB

## 2024-03-30 ENCOUNTER — Telehealth (HOSPITAL_BASED_OUTPATIENT_CLINIC_OR_DEPARTMENT_OTHER): Payer: Self-pay | Admitting: *Deleted

## 2024-03-30 NOTE — Telephone Encounter (Signed)
 Post ED Visit - Positive Culture Follow-up  Culture report reviewed by antimicrobial stewardship pharmacist: [facility] []  Court Distance, Pharm.D. []  Skeet Duke, Pharm.D., BCPS AQ-ID []  Leslee Rase, Pharm.D., BCPS []  Garland Junk, Pharm.D., BCPS []  [place], [address].D., BCPS, AAHIVP []  Alcide Aly, Pharm.D., BCPS, AAHIVP []  Jerri Morale, PharmD, BCPS []  Graham Laws, PharmD, BCPS []  Cleda Curly, PharmD, BCPS []  Tamar Fairly, PharmD []  Ballard Levels, PharmD, BCPS []  Ollen Beverage, PharmD  [facility] []  Arlyne Bering, PharmD []  Sherryle Don, PharmD []  Van Gelinas, PharmD []  Delila Felty, Rph []  [place]) Cleora Daft, PharmD []  Augustina Block, PharmD []  Arie Kurtz, PharmD []  Sharlyn Deaner, PharmD []  Agnes Hose, PharmD []  Kendall Pauls, PharmD []  Gladstone Lamer, PharmD []  Armanda Bern, PharmD [x]  Delpha Fickle, PharmD   Positive urine culture Treated with Cephalexin , organism sensitive to the same and no further patient follow-up is required at this time.  Kiara Bates 03/30/2024, 11:41 AM

## 2024-07-01 ENCOUNTER — Ambulatory Visit (HOSPITAL_COMMUNITY): Admission: EM | Admit: 2024-07-01 | Discharge: 2024-07-01 | Disposition: A | Discharge status: Home / Self Care

## 2024-07-01 ENCOUNTER — Encounter (HOSPITAL_COMMUNITY): Payer: Self-pay

## 2024-07-01 DIAGNOSIS — R739 Hyperglycemia, unspecified: Secondary | ICD-10-CM

## 2024-07-01 LAB — POCT URINALYSIS DIP (MANUAL ENTRY)
Bilirubin, UA: NEGATIVE
Glucose, UA: >=1000 mg/dL — AB
Leukocytes, UA: NEGATIVE
Nitrite, UA: NEGATIVE
Spec Grav, UA: 1.02 (ref 1.010–1.025)
Urobilinogen, UA: 0.2 U/dL
pH, UA: 6 (ref 5.0–8.0)

## 2024-07-01 LAB — POCT FASTING CBG KUC MANUAL ENTRY: POCT Glucose (KUC): 398 mg/dL — AB (ref 70–99)

## 2024-07-01 LAB — POCT URINE PREGNANCY: Preg Test, Ur: NEGATIVE

## 2024-07-01 MED ORDER — METFORMIN HCL ER 500 MG PO TB24: 500.0000 mg | ORAL_TABLET | Freq: Every day | ORAL | 1 refills | Status: AC

## 2024-07-01 NOTE — Discharge Instructions (Addendum)
  1. Hyperglycemia (Primary) - POC CBG monitoring reveals blood glucose of 398 mg/dL. - POC urinalysis dipstick shows negative leukocytes, negative nitrite, trace blood, trace protein, moderate ketones, greater than 1000 glucose milligrams per deciliter. - POCT urine pregnancy completed in UC is negative.  - Take prescribed metformin  (Glucophage  XR) 500 mg daily for glucose control. - Follow-up on Thursday with primary care provider and follow their instructions for further management of suspected diabetes type 2. -Continue to monitor symptoms for any change in severity if there is any escalation of current symptoms or development of new symptoms follow-up in ER for further evaluation and management.

## 2024-07-01 NOTE — ED Provider Notes (Signed)
 [facility]  Note:  This document was prepared using Dragon voice recognition software and may include unintentional dictation errors.  MRN: [id number] DOB: 09/26/1986  Subjective:   Kiara Bates is a 39 y.o. female presenting for increased urinary frequency, excessive thirst, numbness and tingling to feet, fatigue that started last night.  States her blood glucose this morning at 10 AM was around 350 mg/dL.  Patient is concerned for hyperglycemia.  Patient reports that her doctor has informed her that she is prediabetic but is not currently taking any medication to control blood glucose.  Patient states that she does not eat that much sugar but thinks that it may be carbohydrate intake that may be increasing her glucose.  Patient denies any loss of consciousness, shortness of breath, chest pain, weakness, dizziness.  No current facility-administered medications for this encounter.  Current Outpatient Medications:    metFORMIN  (GLUCOPHAGE -XR) 500 MG 24 hr tablet, Take 1 tablet (500 mg total) by mouth daily with breakfast., Disp: 30 tablet, Rfl: 1   acetaminophen  (TYLENOL ) 325 MG tablet, Take 650 mg by mouth every 6 (six) hours as needed for mild pain or headache. (Patient not taking: Reported on 02/13/2023), Disp: , Rfl:    artificial tears (LACRILUBE) OINT ophthalmic ointment, Place into the right eye 3 (three) times daily. (Patient not taking: Reported on 02/13/2023), Disp: 3.5 g, Rfl: 0   erythromycin  ophthalmic ointment, Place a 1/2 inch ribbon of ointment into the right lower eyelid every 6 hours x 3 days (Patient not taking: Reported on 02/13/2023), Disp: 3.5 g, Rfl: 0   ibuprofen  (ADVIL ,MOTRIN ) 600 MG tablet, Take 1 tablet (600 mg total) by mouth every 6 (six) hours as needed. (Patient not taking: Reported on 02/13/2023), Disp: 30 tablet, Rfl: 0   Multiple Vitamin (MULTIVITAMIN WITH MINERALS) TABS tablet, Take 1 tablet by mouth daily. (Patient not taking: Reported on 02/13/2023),  Disp: , Rfl:    Allergies  Allergen Reactions   Penicillins Rash    Past Medical History:  Diagnosis Date   Medical history non-contributory    No pertinent past medical history      Past Surgical History:  Procedure Laterality Date   CESAREAN SECTION      History reviewed. No pertinent family history.  Social History   Tobacco Use   Smoking status: Never   Smokeless tobacco: Never  Vaping Use   Vaping status: Unknown  Substance Use Topics   Alcohol use: No   Drug use: No    ROS Refer to HPI for ROS details.  Objective:   Vitals: BP (!) 145/96 (BP Location: Left Arm)   Pulse 97   Temp 99.1 F (37.3 C) (Oral)   Resp 18   SpO2 96%   Physical Exam Vitals and nursing note reviewed.  Constitutional:      General: She is not in acute distress.    Appearance: Normal appearance. She is well-developed. She is not ill-appearing or toxic-appearing.  HENT:     Head: Normocephalic and atraumatic.     Mouth/Throat:     Mouth: Mucous membranes are moist.  Eyes:     General:        Right eye: No discharge.        Left eye: No discharge.     Extraocular Movements: Extraocular movements intact.     Conjunctiva/sclera: Conjunctivae normal.  Cardiovascular:     Rate and Rhythm: Normal rate.  Pulmonary:     Effort: Pulmonary effort is normal. No respiratory  distress.  Skin:    General: Skin is warm and dry.  Neurological:     General: No focal deficit present.     Mental Status: She is alert and oriented to person, place, and time.  Psychiatric:        Mood and Affect: Mood normal.        Behavior: Behavior normal.     Procedures  Results for orders placed or performed during the hospital encounter of 07/01/24 (from the past 24 hours)  POC CBG monitoring     Status: Abnormal   Collection Time: 07/01/24  2:39 PM  Result Value Ref Range   POCT Glucose ([facility]) 398 (A) 70 - 99 mg/dL  POC urinalysis dipstick     Status: Abnormal   Collection Time: 07/01/24  2:54  PM  Result Value Ref Range   Color, UA yellow yellow   Clarity, UA cloudy (A) clear   Glucose, UA >=1,000 (A) negative mg/dL   Bilirubin, UA negative negative   Ketones, POC UA moderate (40) (A) negative mg/dL   Spec Grav, UA 8.979 8.989 - 1.025   Blood, UA trace-intact (A) negative   pH, UA 6.0 5.0 - 8.0   Protein Ur, POC trace (A) negative mg/dL   Urobilinogen, UA 0.2 0.2 or 1.0 E.U./dL   Nitrite, UA Negative Negative   Leukocytes, UA Negative Negative  POCT urine pregnancy     Status: None   Collection Time: 07/01/24  2:54 PM  Result Value Ref Range   Preg Test, Ur Negative Negative    No results found.   Assessment and Plan :     Discharge Instructions       1. Hyperglycemia (Primary) - POC CBG monitoring reveals blood glucose of 398 mg/dL. - POC urinalysis dipstick shows negative leukocytes, negative nitrite, trace blood, trace protein, moderate ketones, greater than 1000 glucose milligrams per deciliter. - POCT urine pregnancy completed in UC is negative.  - Take prescribed metformin  (Glucophage  XR) 500 mg daily for glucose control. - Follow-up on Thursday with primary care provider and follow their instructions for further management of suspected diabetes type 2. -Continue to monitor symptoms for any change in severity if there is any escalation of current symptoms or development of new symptoms follow-up in ER for further evaluation and management.      Kiara Bates Bates Kiara Bates   Kiara Bates, Kiara Bates, Kiara Bates 07/01/24 1526

## 2024-07-01 NOTE — ED Triage Notes (Signed)
 Pt c/o urinary frequency, excessive thirst, numbness to hand/feet, and fatigue since last night. States her CBG was 350 around 10am.
